# Patient Record
Sex: Female | Born: 1970
Health system: Southern US, Community
[De-identification: ages and names within clinical notes are randomized; demographics above are authoritative.]

## PROBLEM LIST (undated history)

## (undated) DIAGNOSIS — T7840XA Allergy, unspecified, initial encounter: Secondary | ICD-10-CM

## (undated) HISTORY — PX: WISDOM TOOTH EXTRACTION: SHX21

## (undated) HISTORY — DX: Allergy, unspecified, initial encounter: T78.40XA

---
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
## 1997-06-01 ENCOUNTER — Other Ambulatory Visit: Admission: RE | Admit: 1997-06-01 | Discharge: 1997-06-01 | Payer: Self-pay | Admitting: Obstetrics and Gynecology

## 1998-12-15 ENCOUNTER — Other Ambulatory Visit: Admission: RE | Admit: 1998-12-15 | Discharge: 1998-12-15 | Payer: Self-pay | Admitting: Obstetrics and Gynecology

## 2000-01-21 ENCOUNTER — Other Ambulatory Visit: Admission: RE | Admit: 2000-01-21 | Discharge: 2000-01-21 | Payer: Self-pay | Admitting: Obstetrics and Gynecology

## 2001-05-20 ENCOUNTER — Other Ambulatory Visit: Admission: RE | Admit: 2001-05-20 | Discharge: 2001-05-20 | Payer: Self-pay | Admitting: Obstetrics and Gynecology

## 2002-01-07 ENCOUNTER — Other Ambulatory Visit: Admission: RE | Admit: 2002-01-07 | Discharge: 2002-01-07 | Payer: Self-pay | Admitting: Obstetrics and Gynecology

## 2002-09-14 ENCOUNTER — Other Ambulatory Visit: Admission: RE | Admit: 2002-09-14 | Discharge: 2002-09-14 | Payer: Self-pay | Admitting: Obstetrics and Gynecology

## 2004-01-18 ENCOUNTER — Other Ambulatory Visit: Admission: RE | Admit: 2004-01-18 | Discharge: 2004-01-18 | Payer: Self-pay | Admitting: Obstetrics and Gynecology

## 2009-05-02 LAB — CONVERTED CEMR LAB: Pap Smear: NORMAL

## 2009-05-02 LAB — HM MAMMOGRAPHY: HM Mammogram: NORMAL

## 2010-03-23 ENCOUNTER — Other Ambulatory Visit: Payer: Self-pay | Admitting: Family Medicine

## 2010-03-23 ENCOUNTER — Ambulatory Visit
Admission: RE | Admit: 2010-03-23 | Discharge: 2010-03-23 | Payer: Self-pay | Source: Home / Self Care | Attending: Family Medicine | Admitting: Family Medicine

## 2010-03-23 LAB — HEPATIC FUNCTION PANEL
ALT: 11 U/L (ref 0–35)
AST: 17 U/L (ref 0–37)
Albumin: 4.3 g/dL (ref 3.5–5.2)
Alkaline Phosphatase: 51 U/L (ref 39–117)
Bilirubin, Direct: 0.1 mg/dL (ref 0.0–0.3)
Total Bilirubin: 0.7 mg/dL (ref 0.3–1.2)
Total Protein: 7.1 g/dL (ref 6.0–8.3)

## 2010-03-23 LAB — CBC WITH DIFFERENTIAL/PLATELET
Basophils Absolute: 0 10*3/uL (ref 0.0–0.1)
Basophils Relative: 0.6 % (ref 0.0–3.0)
Eosinophils Absolute: 0.2 10*3/uL (ref 0.0–0.7)
Eosinophils Relative: 3.1 % (ref 0.0–5.0)
HCT: 38.5 % (ref 36.0–46.0)
Hemoglobin: 13.2 g/dL (ref 12.0–15.0)
Lymphocytes Relative: 34.4 % (ref 12.0–46.0)
Lymphs Abs: 1.9 10*3/uL (ref 0.7–4.0)
MCHC: 34.2 g/dL (ref 30.0–36.0)
MCV: 94 fl (ref 78.0–100.0)
Monocytes Absolute: 0.4 10*3/uL (ref 0.1–1.0)
Monocytes Relative: 7.7 % (ref 3.0–12.0)
Neutro Abs: 3 10*3/uL (ref 1.4–7.7)
Neutrophils Relative %: 54.2 % (ref 43.0–77.0)
Platelets: 233 10*3/uL (ref 150.0–400.0)
RBC: 4.1 Mil/uL (ref 3.87–5.11)
RDW: 13.6 % (ref 11.5–14.6)
WBC: 5.5 10*3/uL (ref 4.5–10.5)

## 2010-03-23 LAB — BASIC METABOLIC PANEL
BUN: 8 mg/dL (ref 6–23)
CO2: 30 mEq/L (ref 19–32)
Calcium: 9.3 mg/dL (ref 8.4–10.5)
Chloride: 105 mEq/L (ref 96–112)
Creatinine, Ser: 0.6 mg/dL (ref 0.4–1.2)
GFR: 123.71 mL/min (ref >60.00)
Glucose, Bld: 79 mg/dL (ref 70–99)
Potassium: 4.2 mEq/L (ref 3.5–5.1)
Sodium: 142 mEq/L (ref 135–145)

## 2010-03-23 LAB — LIPID PANEL
Cholesterol: 162 mg/dL (ref 0–200)
HDL: 70.7 mg/dL (ref >39.00)
LDL Cholesterol: 86 mg/dL (ref 0–99)
Total CHOL/HDL Ratio: 2
Triglycerides: 27 mg/dL (ref 0.0–149.0)
VLDL: 5.4 mg/dL (ref 0.0–40.0)

## 2010-03-23 LAB — TSH: TSH: 0.63 u[IU]/mL (ref 0.35–5.50)

## 2010-04-05 NOTE — Assessment & Plan Note (Signed)
Summary: NEW TO EST//ALP   Vital Signs:  Patient profile:   40 year old female Menstrual status:  regular Height:      61 inches Weight:      122 pounds BMI:     23.14 Temp:     98.1 degrees F oral Pulse rate:   72 / minute Pulse rhythm:   regular Resp:     12 per minute BP sitting:   120 / 72  (left arm) Cuff size:   regular  Vitals Entered By: Sid Falcon LPN (March 23, 2010 10:22 AM)     Menstrual Status regular Last PAP Result normal   History of Present Illness: New patient to establish care for well care visit.  Sees gyn for pap smears and mammograms.  Patient has known chronic medical problems. She requests wellness visit today. No medications. No known allergies. No prior surgeries.  Family history. Mother coronary artery disease with CABG in her 79s. Also history type 2 diabetes. Father with stroke. Mother with hypertension history. Uncle with colon cancer and paternal grandmother with colon cancer.  Patient is divorced. One teenage daughter. Smokes only about 6 cigarettes per week. Rare alcohol. Works in Designer, jewellery.  Preventive Screening-Counseling & Management  Alcohol-Tobacco     Smoking Status: current     Packs/Day: <0.25  Allergies (verified): No Known Drug Allergies  Past History:  Family History: Last updated: 03/23/2010 Colon CA, paternal grandmother, uncle Father, stroke, hypertension Mother Type 2 DM and CABG 49s  Social History: Last updated: 03/23/2010 Occupation:  Designer, jewellery [employer] Divorced Current Smoker, 4-5 cigarettes day Alcohol use-yes one pregnancy one live birth  Risk Factors: Smoking Status: current (03/23/2010) Packs/Day: <0.25 (03/23/2010) PMH-FH-SH reviewed for relevance  Family History: Colon CA, paternal grandmother, uncle Father, stroke, hypertension Mother Type 2 DM and CABG 69s  Social History: Occupation:  Academic librarian Divorced Current Smoker, 4-5  cigarettes day Alcohol use-yes one pregnancy one live birth Occupation:  employed Smoking Status:  current Packs/Day:  <0.25  Review of Systems  The patient denies anorexia, fever, weight loss, weight gain, vision loss, decreased hearing, hoarseness, chest pain, syncope, dyspnea on exertion, peripheral edema, prolonged cough, headaches, hemoptysis, abdominal pain, melena, hematochezia, severe indigestion/heartburn, hematuria, incontinence, genital sores, muscle weakness, suspicious skin lesions, transient blindness, difficulty walking, depression, unusual weight change, abnormal bleeding, enlarged lymph nodes, angioedema, and breast masses.    Physical Exam  General:  Well-developed,well-nourished,in no acute distress; alert,appropriate and cooperative throughout examination Head:  Normocephalic and atraumatic without obvious abnormalities. No apparent alopecia or balding. Eyes:  No corneal or conjunctival inflammation noted. EOMI. Perrla. Funduscopic exam benign, without hemorrhages, exudates or papilledema. Vision grossly normal. Ears:  External ear exam shows no significant lesions or deformities.  Otoscopic examination reveals clear canals, tympanic membranes are intact bilaterally without bulging, retraction, inflammation or discharge. Hearing is grossly normal bilaterally. Mouth:  Oral mucosa and oropharynx without lesions or exudates.  Teeth in good repair. Neck:  No deformities, masses, or tenderness noted. Breasts:  gyn Lungs:  Normal respiratory effort, chest expands symmetrically. Lungs are clear to auscultation, no crackles or wheezes. Heart:  Normal rate and regular rhythm. S1 and S2 normal without gallop, murmur, click, rub or other extra sounds. Abdomen:  Bowel sounds positive,abdomen soft and non-tender without masses, organomegaly or hernias noted. Genitalia:  gyn Msk:  No deformity or scoliosis noted of thoracic or lumbar spine.   Extremities:  No clubbing, cyanosis,  edema, or deformity noted with  normal full range of motion of all joints.   Neurologic:  alert & oriented X3 and cranial nerves II-XII intact.   Skin:  no rashes and no suspicious lesions.   Cervical Nodes:  No lymphadenopathy noted Psych:  Oriented X3, good eye contact, not anxious appearing, and not depressed appearing.     Impression & Recommendations:  Problem # 1:  Preventive Health Care (ICD-V70.0) Discussed smoking cessation, need for regular exercise.  Screening labs obtained.   PVX given smoking status.  Complete Medication List: 1)  Mirena 20 Mcg/24hr Iud (Levonorgestrel) .... Use as directed per dr Tenny Craw  Other Orders: TLB-Lipid Panel (80061-LIPID) TLB-BMP (Basic Metabolic Panel-BMET) (80048-METABOL) TLB-CBC Platelet - w/Differential (85025-CBCD) TLB-Hepatic/Liver Function Pnl (80076-HEPATIC) TLB-TSH (Thyroid Stimulating Hormone) (84443-TSH) Specimen Handling (10272) Pneumococcal Vaccine (53664) Admin 1st Vaccine (40347)  Patient Instructions: 1)  It is important that you exercise reguarly at least 20 minutes 5 times a week. If you develop chest pain, have severe difficulty breathing, or feel very tired, stop exercising immediately and seek medical attention.  2)  Please schedule a follow-up appointment in 1 year.    Orders Added: 1)  TLB-Lipid Panel [80061-LIPID] 2)  TLB-BMP (Basic Metabolic Panel-BMET) [80048-METABOL] 3)  TLB-CBC Platelet - w/Differential [85025-CBCD] 4)  TLB-Hepatic/Liver Function Pnl [80076-HEPATIC] 5)  TLB-TSH (Thyroid Stimulating Hormone) [84443-TSH] 6)  Specimen Handling [99000] 7)  Pneumococcal Vaccine [90732] 8)  Admin 1st Vaccine [90471] 9)  New Patient 40-64 years [99386]   Immunizations Administered:  Pneumonia Vaccine:    Vaccine Type: Pneumovax    Site: left deltoid    Mfr: Merck    Dose: 0.5 ml    Route: IM    Given by: Sid Falcon LPN    Exp. Date: 07/03/2011    Lot #: [id number]   Immunizations  Administered:  Pneumonia Vaccine:    Vaccine Type: Pneumovax    Site: left deltoid    Mfr: Merck    Dose: 0.5 ml    Route: IM    Given by: Sid Falcon LPN    Exp. Date: 07/03/2011    Lot #: [id number]  Preventive Care Screening  Mammogram:    Date:  05/02/2009    Results:  normal   Pap Smear:    Date:  05/02/2009    Results:  normal   Last Tetanus Booster:    Date:  03/04/2008    Results:  Historical

## 2010-07-03 ENCOUNTER — Ambulatory Visit: Payer: Self-pay | Admitting: Internal Medicine

## 2010-07-06 ENCOUNTER — Ambulatory Visit (INDEPENDENT_AMBULATORY_CARE_PROVIDER_SITE_OTHER): Payer: 59 | Admitting: Family Medicine

## 2010-07-06 ENCOUNTER — Encounter: Payer: Self-pay | Admitting: Family Medicine

## 2010-07-06 VITALS — BP 120/80 | Temp 98.0°F | Ht 60.75 in | Wt 122.0 lb

## 2010-07-06 DIAGNOSIS — L259 Unspecified contact dermatitis, unspecified cause: Secondary | ICD-10-CM

## 2010-07-06 MED ORDER — METHYLPREDNISOLONE ACETATE 80 MG/ML IJ SUSP
80.0000 mg | Freq: Once | INTRAMUSCULAR | Status: AC
  Administered 2010-07-06: 80 mg via INTRAMUSCULAR

## 2010-07-06 NOTE — Progress Notes (Signed)
  Subjective:    Patient ID: Ruth Wong, female    DOB: 1970/10/01, 40 y.o.   MRN: [id number]  HPI Patient seen with pruritic rash left arm and bilateral breast past several days. Increase work outdoors recently. No prior history of contact dermatitis. Rash is pruritic. Nonpainful. No associated fever or chills. No alleviating factors. Not alleviated with antihistamines.  Exacerbated by heat.   Review of Systems  Constitutional: Negative for fever and chills.  Skin: Positive for rash.       Objective:   Physical Exam  Constitutional: She appears well-developed and well-nourished. No distress.  Cardiovascular: Normal rate, regular rhythm and normal heart sounds.   Pulmonary/Chest: Effort normal and breath sounds normal. She has no wheezes. She has no rales.  Musculoskeletal: She exhibits no edema.  Skin:       Patient has minimally raised vesicular rash left forearm which is nontender. Similar rash left breast greater than right mostly involving the areolar region.          Assessment & Plan:  Contact dermatitis. Reviewed options. Patient prefers IM steroid versus oral. Depo-Medrol 80 mg given. Discussed prevention measures.

## 2010-07-06 NOTE — Patient Instructions (Signed)
Poison Ivy (Toxicodendron Dermatitis) Poison ivy is a inflammation of the skin (contact dermatitis) caused by touching the allergens on the leaves of the ivy plant following previous exposure to the plant. The rash usually appears 48 hours after exposure. The rash is usually bumps (papules) or blisters (vesicles) in a linear pattern. Depending on your own sensitivity, the rash may simply cause redness and itching, or it may also progress to blisters which may break open. These must be well cared for to prevent secondary bacterial (germ) infection, followed by scarring. Keep any open areas dry, clean, dressed, and covered with an antibacterial ointment if needed. The eyes may also get puffy. The puffiness is worst in the morning and gets better as the day progresses. This dermatitis usually heals without scarring, within 2 to 3 weeks without treatment. HOME CARE INSTRUCTIONS Thoroughly wash with soap and water as soon as you have been exposed to poison ivy. You have about one half hour to remove the plant resin before it will cause the rash. This washing will destroy the oil or antigen on the skin that is causing, or will cause, the rash. Be sure to wash under your fingernails as any plant resin there will continue to spread the rash. Do not rub skin vigorously when washing affected area. Poison ivy cannot spread if no oil from the plant remains on your body. A rash that has progressed to weeping sores will not spread the rash unless you have not washed thoroughly. It is also important to wash any clothes you have been wearing as these may carry active allergens. The rash will return if you wear the unwashed clothing, even several days later. Avoidance of the plant in the future is the best measure. Poison ivy plant can be recognized by the number of leaves. Generally, poison ivy has three leaves with flowering branches on a single stem. Diphenhydramine may be purchased over the counter and used as needed for  itching. Do not drive with this medication if it makes you drowsy. Ask your caregiver about medication for children. SEEK MEDICAL CARE IF   Open sores develop.   Redness spreads beyond area of rash.   You notice purulent (pus-like) discharge.   You have increased pain.   Other signs of infection develop (such as fever).  Document Released: 02/16/2000 Document Re-Released: 02/06/2009 Exit[employer] Patient Information 2011 [employer]. 

## 2010-11-19 ENCOUNTER — Telehealth: Payer: Self-pay | Admitting: *Deleted

## 2010-11-19 MED ORDER — VALACYCLOVIR HCL 1 G PO TABS: ORAL_TABLET | ORAL | Status: DC

## 2010-11-19 NOTE — Telephone Encounter (Signed)
Will refill.

## 2010-11-19 NOTE — Telephone Encounter (Signed)
Pt is asking for Valtrex for oral fever blisters ASAP.

## 2011-05-01 ENCOUNTER — Telehealth: Payer: Self-pay | Admitting: *Deleted

## 2011-05-01 MED ORDER — VALACYCLOVIR HCL 1 G PO TABS: ORAL_TABLET | ORAL | Status: AC

## 2011-05-01 NOTE — Telephone Encounter (Signed)
OK to refill #30 tablets.

## 2011-05-01 NOTE — Telephone Encounter (Signed)
Pt.notified

## 2011-05-01 NOTE — Telephone Encounter (Signed)
Pt is asking for a refill of Valtrex for her fever blisters.

## 2012-06-29 ENCOUNTER — Ambulatory Visit (INDEPENDENT_AMBULATORY_CARE_PROVIDER_SITE_OTHER): Payer: BC Managed Care – PPO | Admitting: Family Medicine

## 2012-06-29 ENCOUNTER — Encounter: Payer: Self-pay | Admitting: Family Medicine

## 2012-06-29 VITALS — BP 112/80 | Temp 98.5°F | Wt 125.0 lb

## 2012-06-29 DIAGNOSIS — R300 Dysuria: Secondary | ICD-10-CM

## 2012-06-29 LAB — POCT URINALYSIS DIPSTICK
Bilirubin, UA: NEGATIVE
Glucose, UA: NEGATIVE
Ketones, UA: NEGATIVE
Leukocytes, UA: NEGATIVE
Nitrite, UA: POSITIVE
Protein, UA: NEGATIVE
Spec Grav, UA: 1.02
Urobilinogen, UA: 0.2
pH, UA: 6.5

## 2012-06-29 MED ORDER — CIPROFLOXACIN HCL 500 MG PO TABS: 500.0000 mg | ORAL_TABLET | Freq: Two times a day (BID) | ORAL | Status: DC

## 2012-06-29 NOTE — Progress Notes (Signed)
Chief Complaint  Patient presents with  . Abdominal Pain    frequent urination    HPI:  Acute visit for urinary dysuria: -2 days -symptoms: urinary frequency and urgency and some bladder pain/pressure when needs to urinate -she took some pyridium 2 days ago which helped -denies: vaginal symptoms, flank pain, NVD, fevers -reports no history of UTIs -no chance of pregnancy - mirena and husband has vasectomy  ROS: See pertinent positives and negatives per HPI.  No past medical history on file.  Family History  Problem Relation Age of Onset  . Diabetes Mother   . Heart disease Mother     cabg  . Stroke Father   . Hypertension Father   . Cancer Paternal Uncle     colon  . Cancer Paternal Grandmother     colon    History   Social History  . Marital Status: Divorced    Spouse Name: N/A    Number of Children: N/A  . Years of Education: N/A   Social History Main Topics  . Smoking status: Current Some Day Smoker -- 10 years  . Smokeless tobacco: None  . Alcohol Use: None  . Drug Use: None  . Sexually Active: None   Other Topics Concern  . None   Social History Narrative  . None    Current outpatient prescriptions:ciprofloxacin (CIPRO) 500 MG tablet, Take 1 tablet (500 mg total) by mouth 2 (two) times daily., Disp: 6 tablet, Rfl: 0;  levonorgestrel (MIRENA) 20 MCG/24HR IUD, 1 each by Intrauterine route once. As directed by Dr. Tenny Craw , Disp: , Rfl: ;  valACYclovir (VALTREX) 1000 MG tablet, Two tablets twice daily for one day prn cold sores, Disp: 30 tablet, Rfl: 0  EXAM:  Filed Vitals:   06/29/12 1433  BP: 112/80  Temp: 98.5 F (36.9 C)    Body mass index is 23.82 kg/(m^2).  GENERAL: vitals reviewed and listed above, alert, oriented, appears well hydrated and in no acute distress  HEENT: atraumatic, conjunttiva clear, no obvious abnormalities on inspection of external nose and ears  NECK: no obvious masses on inspection  LUNGS: clear to auscultation  bilaterally, no wheezes, rales or rhonchi, good air movement  CV: HRRR, no peripheral edema  ABD: soft, NTTP, no CVA TTP, no rebound or guarding  MS: moves all extremities without noticeable abnormality  PSYCH: pleasant and cooperative, no obvious depression or anxiety  ASSESSMENT AND PLAN:  Discussed the following assessment and plan:  Dysuria - Plan: DISCONTINUED: ciprofloxacin (CIPRO) 500 MG tablet  -nit+ tr bld on udip. Culture pending. Tx empirically with cipro - risks discussed. Advised plenty of fluids, cranberry/blueberry juice, return precuations.  -Patient advised to return or notify a doctor immediately if symptoms worsen or persist or new concerns arise.  There are no Patient Instructions on file for this visit.   Kriste Basque R.

## 2012-06-29 NOTE — Addendum Note (Signed)
Addended by: Azucena Freed on: 06/29/2012 02:50 PM   Modules accepted: Orders

## 2012-07-01 LAB — URINE CULTURE
Colony Count: NO GROWTH
Organism ID, Bacteria: NO GROWTH

## 2013-04-26 ENCOUNTER — Encounter: Payer: Self-pay | Admitting: Family Medicine

## 2013-04-26 ENCOUNTER — Ambulatory Visit (INDEPENDENT_AMBULATORY_CARE_PROVIDER_SITE_OTHER): Payer: BC Managed Care – PPO | Admitting: Family Medicine

## 2013-04-26 VITALS — BP 112/82 | HR 85 | Temp 98.1°F | Wt 126.0 lb

## 2013-04-26 DIAGNOSIS — B349 Viral infection, unspecified: Secondary | ICD-10-CM

## 2013-04-26 DIAGNOSIS — B9789 Other viral agents as the cause of diseases classified elsewhere: Secondary | ICD-10-CM

## 2013-04-26 NOTE — Progress Notes (Signed)
Pre visit review using our clinic review tool, if applicable. No additional management support is needed unless otherwise documented below in the visit note. 

## 2013-04-26 NOTE — Patient Instructions (Signed)

## 2013-04-26 NOTE — Progress Notes (Signed)
   Subjective:    Patient ID: Ruth Wong, female    DOB: 30-Oct-1970, 43 y.o.   MRN: [id number]  Neck Pain  Pertinent negatives include no fever.   Patient seen for acute visit with cough, nasal congestion, sore throat, and body aches over the past week. She does couple tender nodes left side of neck. Sore throat symptoms are relatively mild. She has not taken over-the-counter medications. Denies any fever. No nausea or vomiting. No skin rashes. Only mild intermittent headache  No past medical history on file. No past surgical history on file.  reports that she has been smoking.  She does not have any smokeless tobacco history on file. Her alcohol and drug histories are not on file. family history includes Cancer in her paternal grandmother and paternal uncle; Diabetes in her mother; Heart disease in her mother; Hypertension in her father; Stroke in her father. No Known Allergies    Review of Systems  Constitutional: Positive for fatigue. Negative for fever and chills.  HENT: Positive for congestion and sore throat.   Respiratory: Positive for cough.   Musculoskeletal: Positive for neck pain.       Objective:   Physical Exam  Constitutional: She appears well-developed and well-nourished.  HENT:  Right Ear: External ear normal.  Left Ear: External ear normal.  Mouth/Throat: Oropharynx is clear and moist.  Neck: Neck supple.  Couple of slightly tender left anterior cervical nodes. These are fairly small about 1/2 cm diameter  Cardiovascular: Normal rate.   Pulmonary/Chest: Effort normal and breath sounds normal. No respiratory distress. She has no wheezes. She has no rales.          Assessment & Plan:  Viral syndrome. Reassurance. She has likely some benign reactive adenopathy. Symptomatic treatment.

## 2013-04-27 ENCOUNTER — Other Ambulatory Visit (INDEPENDENT_AMBULATORY_CARE_PROVIDER_SITE_OTHER): Payer: BC Managed Care – PPO

## 2013-04-27 ENCOUNTER — Telehealth: Payer: Self-pay | Admitting: Family Medicine

## 2013-04-27 DIAGNOSIS — Z0000 Encounter for general adult medical examination without abnormal findings: Secondary | ICD-10-CM

## 2013-04-27 LAB — POCT URINALYSIS DIPSTICK
Bilirubin, UA: NEGATIVE
Glucose, UA: NEGATIVE
Ketones, UA: NEGATIVE
Nitrite, UA: NEGATIVE
Protein, UA: NEGATIVE
Spec Grav, UA: 1.025
Urobilinogen, UA: 0.2
pH, UA: 5.5

## 2013-04-27 LAB — CBC WITH DIFFERENTIAL/PLATELET
Basophils Absolute: 0 10*3/uL (ref 0.0–0.1)
Basophils Relative: 0.5 % (ref 0.0–3.0)
Eosinophils Absolute: 0.1 10*3/uL (ref 0.0–0.7)
Eosinophils Relative: 1.3 % (ref 0.0–5.0)
HCT: 41.6 % (ref 36.0–46.0)
Hemoglobin: 13.8 g/dL (ref 12.0–15.0)
Lymphocytes Relative: 18.9 % (ref 12.0–46.0)
Lymphs Abs: 1.7 10*3/uL (ref 0.7–4.0)
MCHC: 33.2 g/dL (ref 30.0–36.0)
MCV: 95.4 fl (ref 78.0–100.0)
Monocytes Absolute: 0.5 10*3/uL (ref 0.1–1.0)
Monocytes Relative: 5.8 % (ref 3.0–12.0)
Neutro Abs: 6.5 10*3/uL (ref 1.4–7.7)
Neutrophils Relative %: 73.5 % (ref 43.0–77.0)
Platelets: 250 10*3/uL (ref 150.0–400.0)
RBC: 4.36 Mil/uL (ref 3.87–5.11)
RDW: 13.6 % (ref 11.5–14.6)
WBC: 8.9 10*3/uL (ref 4.5–10.5)

## 2013-04-27 LAB — BASIC METABOLIC PANEL
BUN: 11 mg/dL (ref 6–23)
CO2: 27 mEq/L (ref 19–32)
Calcium: 9.4 mg/dL (ref 8.4–10.5)
Chloride: 106 mEq/L (ref 96–112)
Creatinine, Ser: 0.7 mg/dL (ref 0.4–1.2)
GFR: 104.78 mL/min (ref >60.00)
Glucose, Bld: 80 mg/dL (ref 70–99)
Potassium: 4.6 mEq/L (ref 3.5–5.1)
Sodium: 140 mEq/L (ref 135–145)

## 2013-04-27 LAB — HEPATIC FUNCTION PANEL
ALT: 16 U/L (ref 0–35)
AST: 19 U/L (ref 0–37)
Albumin: 4.2 g/dL (ref 3.5–5.2)
Alkaline Phosphatase: 44 U/L (ref 39–117)
Bilirubin, Direct: 0 mg/dL (ref 0.0–0.3)
Total Bilirubin: 0.6 mg/dL (ref 0.3–1.2)
Total Protein: 7.3 g/dL (ref 6.0–8.3)

## 2013-04-27 LAB — LIPID PANEL
Cholesterol: 162 mg/dL (ref 0–200)
HDL: 63.7 mg/dL (ref >39.00)
LDL Cholesterol: 86 mg/dL (ref 0–99)
Total CHOL/HDL Ratio: 3
Triglycerides: 61 mg/dL (ref 0.0–149.0)
VLDL: 12.2 mg/dL (ref 0.0–40.0)

## 2013-04-27 LAB — TSH: TSH: 0.5 u[IU]/mL (ref 0.35–5.50)

## 2013-04-27 NOTE — Telephone Encounter (Signed)
Relevant patient education assigned to patient using Emmi. ° °

## 2013-05-05 ENCOUNTER — Telehealth: Payer: Self-pay | Admitting: Family Medicine

## 2013-05-05 ENCOUNTER — Encounter: Payer: Self-pay | Admitting: Family Medicine

## 2013-05-05 ENCOUNTER — Ambulatory Visit (INDEPENDENT_AMBULATORY_CARE_PROVIDER_SITE_OTHER): Payer: BC Managed Care – PPO | Admitting: Family Medicine

## 2013-05-05 VITALS — BP 120/80 | HR 94 | Temp 97.6°F | Ht 60.75 in | Wt 127.0 lb

## 2013-05-05 DIAGNOSIS — R319 Hematuria, unspecified: Secondary | ICD-10-CM

## 2013-05-05 DIAGNOSIS — Z0000 Encounter for general adult medical examination without abnormal findings: Secondary | ICD-10-CM

## 2013-05-05 LAB — POCT URINALYSIS DIPSTICK
Bilirubin, UA: NEGATIVE
Glucose, UA: NEGATIVE
Ketones, UA: NEGATIVE
Leukocytes, UA: NEGATIVE
Nitrite, UA: NEGATIVE
Protein, UA: NEGATIVE
Spec Grav, UA: >=1.03
Urobilinogen, UA: 0.2
pH, UA: 5.5

## 2013-05-05 LAB — URINALYSIS, MICROSCOPIC ONLY

## 2013-05-05 NOTE — Progress Notes (Signed)
   Subjective:    Patient ID: Ruth Wong, female    DOB: 09/27/1970, 43 y.o.   MRN: [id number]  HPI Patient here for complete physical. She sees gynecologist regularly. She has Mirena IUD. She has had mammograms and Pap smears through gynecologist. She smokes but less than half a pack of cigarettes per week. She exercises fairly regularly. No specific complaints.  Family history reviewed and as below.  She's had some intermittent right shoulder pains over the past couple months. No injury. Pain is worse with abduction and internal rotation. No neck pain. No weakness. Pain is relatively mild  No past medical history on file. No past surgical history on file.  reports that she has been smoking.  She does not have any smokeless tobacco history on file. Her alcohol and drug histories are not on file. family history includes Cancer in her paternal grandmother and paternal uncle; Diabetes in her mother; Heart disease (age of onset: 86) in her mother; Hypertension in her father; Stroke in her father. No Known Allergies    Review of Systems  Constitutional: Negative for fever, activity change, appetite change, fatigue and unexpected weight change.  HENT: Negative for ear pain, hearing loss, sore throat and trouble swallowing.   Eyes: Negative for visual disturbance.  Respiratory: Negative for cough and shortness of breath.   Cardiovascular: Negative for chest pain and palpitations.  Gastrointestinal: Negative for abdominal pain, diarrhea, constipation and blood in stool.  Genitourinary: Negative for dysuria and hematuria.  Musculoskeletal: Negative for arthralgias, back pain and myalgias.  Skin: Negative for rash.  Neurological: Negative for dizziness, syncope and headaches.  Hematological: Negative for adenopathy.  Psychiatric/Behavioral: Negative for confusion and dysphoric mood.       Objective:   Physical Exam  Constitutional: She is oriented to person, place, and time. She  appears well-developed and well-nourished.  HENT:  Head: Normocephalic and atraumatic.  Eyes: EOM are normal. Pupils are equal, round, and reactive to light.  Neck: Normal range of motion. Neck supple. No thyromegaly present.  Cardiovascular: Normal rate, regular rhythm and normal heart sounds.   No murmur heard. Pulmonary/Chest: Breath sounds normal. No respiratory distress. She has no wheezes. She has no rales.  Abdominal: Soft. Bowel sounds are normal. She exhibits no distension and no mass. There is no tenderness. There is no rebound and no guarding.  Genitourinary:  Per GYN  Musculoskeletal: Normal range of motion. She exhibits no edema.  Right shoulder reveals full range of motion. She has mild pain with abduction greater than 90 and internal rotation  Lymphadenopathy:    She has no cervical adenopathy.  Neurological: She is alert and oriented to person, place, and time. She displays normal reflexes. No cranial nerve deficit.  Skin: No rash noted.  Psychiatric: She has a normal mood and affect. Her behavior is normal. Judgment and thought content normal.          Assessment & Plan:  Complete physical. Labs reviewed. She has trace blood on dipstick. Repeat and if positive send urine micro- Labs reviewed with no major concerns. Continue regular exercise habits.

## 2013-05-05 NOTE — Telephone Encounter (Signed)
Relevant patient education assigned to patient using Emmi. ° °

## 2013-05-05 NOTE — Progress Notes (Signed)
Pre visit review using our clinic review tool, if applicable. No additional management support is needed unless otherwise documented below in the visit note. 

## 2013-08-09 ENCOUNTER — Other Ambulatory Visit: Payer: Self-pay | Admitting: Obstetrics and Gynecology

## 2013-08-09 DIAGNOSIS — N6310 Unspecified lump in the right breast, unspecified quadrant: Secondary | ICD-10-CM

## 2013-08-13 ENCOUNTER — Ambulatory Visit
Admission: RE | Admit: 2013-08-13 | Discharge: 2013-08-13 | Disposition: A | Discharge status: Home / Self Care | Payer: BC Managed Care – PPO | Source: Ambulatory Visit | Attending: Obstetrics and Gynecology | Admitting: Obstetrics and Gynecology

## 2013-08-13 DIAGNOSIS — N6310 Unspecified lump in the right breast, unspecified quadrant: Secondary | ICD-10-CM

## 2014-07-15 ENCOUNTER — Other Ambulatory Visit (INDEPENDENT_AMBULATORY_CARE_PROVIDER_SITE_OTHER): Payer: Managed Care, Other (non HMO)

## 2014-07-15 DIAGNOSIS — Z0000 Encounter for general adult medical examination without abnormal findings: Secondary | ICD-10-CM | POA: Diagnosis not present

## 2014-07-15 LAB — CBC WITH DIFFERENTIAL/PLATELET
Basophils Absolute: 0 10*3/uL (ref 0.0–0.1)
Basophils Relative: 0.5 % (ref 0.0–3.0)
Eosinophils Absolute: 0.2 10*3/uL (ref 0.0–0.7)
Eosinophils Relative: 2.1 % (ref 0.0–5.0)
HCT: 39.5 % (ref 36.0–46.0)
Hemoglobin: 13.5 g/dL (ref 12.0–15.0)
Lymphocytes Relative: 17.6 % (ref 12.0–46.0)
Lymphs Abs: 1.5 10*3/uL (ref 0.7–4.0)
MCHC: 34.2 g/dL (ref 30.0–36.0)
MCV: 91.5 fl (ref 78.0–100.0)
Monocytes Absolute: 0.6 10*3/uL (ref 0.1–1.0)
Monocytes Relative: 7.1 % (ref 3.0–12.0)
Neutro Abs: 6.3 10*3/uL (ref 1.4–7.7)
Neutrophils Relative %: 72.7 % (ref 43.0–77.0)
Platelets: 261 10*3/uL (ref 150.0–400.0)
RBC: 4.32 Mil/uL (ref 3.87–5.11)
RDW: 14.1 % (ref 11.5–15.5)
WBC: 8.7 10*3/uL (ref 4.0–10.5)

## 2014-07-15 LAB — LIPID PANEL
Cholesterol: 157 mg/dL (ref 0–200)
HDL: 61.4 mg/dL (ref >39.00)
LDL Cholesterol: 88 mg/dL (ref 0–99)
NonHDL: 95.6
Total CHOL/HDL Ratio: 3
Triglycerides: 38 mg/dL (ref 0.0–149.0)
VLDL: 7.6 mg/dL (ref 0.0–40.0)

## 2014-07-15 LAB — COMPREHENSIVE METABOLIC PANEL
ALT: 15 U/L (ref 0–35)
AST: 18 U/L (ref 0–37)
Albumin: 4.1 g/dL (ref 3.5–5.2)
Alkaline Phosphatase: 54 U/L (ref 39–117)
BUN: 12 mg/dL (ref 6–23)
CO2: 27 mEq/L (ref 19–32)
Calcium: 9.3 mg/dL (ref 8.4–10.5)
Chloride: 103 mEq/L (ref 96–112)
Creatinine, Ser: 0.6 mg/dL (ref 0.40–1.20)
GFR: 114.3 mL/min (ref >60.00)
Glucose, Bld: 88 mg/dL (ref 70–99)
Potassium: 4 mEq/L (ref 3.5–5.1)
Sodium: 138 mEq/L (ref 135–145)
Total Bilirubin: 0.5 mg/dL (ref 0.2–1.2)
Total Protein: 7.1 g/dL (ref 6.0–8.3)

## 2014-07-15 LAB — TSH: TSH: 0.91 u[IU]/mL (ref 0.35–4.50)

## 2014-07-20 ENCOUNTER — Ambulatory Visit (INDEPENDENT_AMBULATORY_CARE_PROVIDER_SITE_OTHER): Payer: Managed Care, Other (non HMO) | Admitting: Family Medicine

## 2014-07-20 ENCOUNTER — Encounter: Payer: Self-pay | Admitting: Family Medicine

## 2014-07-20 ENCOUNTER — Telehealth: Payer: Self-pay

## 2014-07-20 VITALS — BP 120/80 | HR 91 | Temp 98.4°F | Ht 60.63 in | Wt 126.0 lb

## 2014-07-20 DIAGNOSIS — Z0000 Encounter for general adult medical examination without abnormal findings: Secondary | ICD-10-CM | POA: Diagnosis not present

## 2014-07-20 NOTE — Progress Notes (Signed)
   Subjective:    Patient ID: Ruth Wong, female    DOB: 08-27-1970, 44 y.o.   MRN: [id number]  HPI Patient here for complete physical. She sees gynecologist for yearly Pap smears. She has Mirena IUD. Still smokes about 3 cigarettes per day. Somewhat inconsistent exercise over the past year. She takes no regular prescription medications.  Family history significant for grandmother with colon cancer. Also, uncle died age 28 of colon cancer. No first-degree relatives with colon cancer. Brother age 51 apparently had polyps during the past year in she's not sure if these were adenomatous or hyperplastic. Patient has not had any change of bowel habits.  No past medical history on file. No past surgical history on file.  reports that she has been smoking.  She does not have any smokeless tobacco history on file. Her alcohol and drug histories are not on file. family history includes Cancer in her paternal grandmother and paternal uncle; Diabetes in her mother; Heart disease (age of onset: 19) in her mother; Hypertension in her father; Stroke in her father. No Known Allergies    Review of Systems  Constitutional: Negative for fever, activity change, appetite change, fatigue and unexpected weight change.  HENT: Negative for ear pain, hearing loss, sore throat and trouble swallowing.   Eyes: Negative for visual disturbance.  Respiratory: Negative for cough and shortness of breath.   Cardiovascular: Negative for chest pain and palpitations.  Gastrointestinal: Negative for abdominal pain, diarrhea, constipation and blood in stool.  Genitourinary: Negative for dysuria and hematuria.  Musculoskeletal: Negative for myalgias, back pain and arthralgias.  Skin: Negative for rash.  Neurological: Negative for dizziness, syncope and headaches.  Hematological: Negative for adenopathy.  Psychiatric/Behavioral: Negative for confusion and dysphoric mood.       Objective:   Physical Exam  Constitutional:  She is oriented to person, place, and time. She appears well-developed and well-nourished.  HENT:  Head: Normocephalic and atraumatic.  Eyes: EOM are normal. Pupils are equal, round, and reactive to light.  Neck: Normal range of motion. Neck supple. No thyromegaly present.  Cardiovascular: Normal rate, regular rhythm and normal heart sounds.   No murmur heard. Pulmonary/Chest: Breath sounds normal. No respiratory distress. She has no wheezes. She has no rales.  Abdominal: Soft. Bowel sounds are normal. She exhibits no distension and no mass. There is no tenderness. There is no rebound and no guarding.  Genitourinary:  Per GYN  Musculoskeletal: Normal range of motion. She exhibits no edema.  Lymphadenopathy:    She has no cervical adenopathy.  Neurological: She is alert and oriented to person, place, and time. She displays normal reflexes. No cranial nerve deficit.  Skin: No rash noted.  Psychiatric: She has a normal mood and affect. Her behavior is normal. Judgment and thought content normal.          Assessment & Plan:  Complete physical. Labs reviewed with no major concerns. She is strongly encouraged to stop smoking altogether and she has a goal of trying to quit this year. Establish more consistent exercise

## 2014-07-20 NOTE — Progress Notes (Signed)
Pre visit review using our clinic review tool, if applicable. No additional management support is needed unless otherwise documented below in the visit note. 

## 2014-07-20 NOTE — Telephone Encounter (Signed)
Pt states that her brother goes to get colonoscopy every 3 years

## 2014-07-20 NOTE — Patient Instructions (Signed)
Find out if your brother's polyps were adenomas (higher risk) or hyperplastic (low risk)

## 2014-07-26 NOTE — Telephone Encounter (Signed)
If brother has adenomas at 37 (and presumably he does if getting screened every 3 years) then I would recommend we go ahead and set her up for screening colonoscopy.

## 2014-07-27 ENCOUNTER — Encounter: Payer: Self-pay | Admitting: Gastroenterology

## 2014-07-27 ENCOUNTER — Other Ambulatory Visit: Payer: Self-pay | Admitting: Family Medicine

## 2014-07-27 DIAGNOSIS — Z1211 Encounter for screening for malignant neoplasm of colon: Secondary | ICD-10-CM

## 2014-07-27 NOTE — Telephone Encounter (Signed)
Pt informed. Per patient she would like to go ahead with colonoscopy. Referral is ordered.

## 2014-08-15 ENCOUNTER — Encounter: Payer: Self-pay | Admitting: Family Medicine

## 2014-08-15 ENCOUNTER — Ambulatory Visit (INDEPENDENT_AMBULATORY_CARE_PROVIDER_SITE_OTHER): Payer: Managed Care, Other (non HMO) | Admitting: Family Medicine

## 2014-08-15 VITALS — BP 110/80 | HR 68 | Temp 98.4°F | Wt 128.0 lb

## 2014-08-15 DIAGNOSIS — B49 Unspecified mycosis: Secondary | ICD-10-CM | POA: Diagnosis not present

## 2014-08-15 DIAGNOSIS — R21 Rash and other nonspecific skin eruption: Secondary | ICD-10-CM | POA: Diagnosis not present

## 2014-08-15 DIAGNOSIS — L300 Nummular dermatitis: Secondary | ICD-10-CM

## 2014-08-15 MED ORDER — NAFTIFINE HCL 1 % EX CREA: TOPICAL_CREAM | Freq: Every day | CUTANEOUS | Status: DC

## 2014-08-15 NOTE — Progress Notes (Signed)
   Subjective:    Patient ID: Ruth Wong, female    DOB: 30-Mar-1970, 44 y.o.   MRN: [id number]  HPI Patient seen with skin rash. She had appearance of nummular rash left forearm and right upper shoulder last week. Apparently coworker had ringworm. She started clotrimazole cream over-the-counter couple days ago was only uses for couple days. She has some itching. No pain. No vesicles.  No past medical history on file. No past surgical history on file.  reports that she has been smoking.  She does not have any smokeless tobacco history on file. Her alcohol and drug histories are not on file. family history includes Cancer in her paternal grandmother and paternal uncle; Diabetes in her mother; Heart disease (age of onset: 55) in her mother; Hypertension in her father; Stroke in her father. No Known Allergies    Review of Systems  Constitutional: Negative for fever and chills.  Skin: Positive for rash.       Objective:   Physical Exam  Constitutional: She appears well-developed and well-nourished.  Cardiovascular: Normal rate and regular rhythm.   Skin: Rash noted.  She has approximate 1 cm symmetric circumferential rash right forearm distal wrist and similar lesion right upper back. Elevated erythematous border with central scaling          Assessment & Plan:  Skin rash. Suspect ringworm. Continue over-the-counter clotrimazole. KOH scraping obtained. Consider course of Naftin cream if not responding to clotrimazole

## 2014-08-15 NOTE — Progress Notes (Signed)
Pre visit review using our clinic review tool, if applicable. No additional management support is needed unless otherwise documented below in the visit note. 

## 2014-08-15 NOTE — Patient Instructions (Signed)
Continue with the Clotrimazole for now.  We will call you with the KOH result- this will confirm whether this is fungal.

## 2014-08-16 LAB — KOH PREP: RESULT - KOH: NONE SEEN

## 2014-08-17 ENCOUNTER — Telehealth: Payer: Self-pay | Admitting: Family Medicine

## 2014-08-17 ENCOUNTER — Other Ambulatory Visit: Payer: Self-pay

## 2014-08-17 MED ORDER — TRIAMCINOLONE ACETONIDE 0.1 % EX CREA: 1.0000 "application " | TOPICAL_CREAM | Freq: Two times a day (BID) | CUTANEOUS | Status: DC

## 2014-08-17 NOTE — Telephone Encounter (Addendum)
Pt had KOH on 6-13 and would like the results

## 2014-08-17 NOTE — Telephone Encounter (Signed)
Pt informed

## 2014-09-28 ENCOUNTER — Ambulatory Visit: Payer: Managed Care, Other (non HMO) | Admitting: Gastroenterology

## 2015-04-03 ENCOUNTER — Other Ambulatory Visit: Payer: Self-pay | Admitting: Obstetrics and Gynecology

## 2015-04-03 DIAGNOSIS — N6320 Unspecified lump in the left breast, unspecified quadrant: Secondary | ICD-10-CM

## 2015-04-04 ENCOUNTER — Other Ambulatory Visit: Payer: Managed Care, Other (non HMO)

## 2015-04-07 ENCOUNTER — Ambulatory Visit
Admission: RE | Admit: 2015-04-07 | Discharge: 2015-04-07 | Disposition: A | Discharge status: Home / Self Care | Payer: Managed Care, Other (non HMO) | Source: Ambulatory Visit | Attending: Obstetrics and Gynecology | Admitting: Obstetrics and Gynecology

## 2015-04-07 DIAGNOSIS — N6320 Unspecified lump in the left breast, unspecified quadrant: Secondary | ICD-10-CM

## 2015-04-13 ENCOUNTER — Ambulatory Visit (INDEPENDENT_AMBULATORY_CARE_PROVIDER_SITE_OTHER): Payer: Managed Care, Other (non HMO) | Admitting: Family Medicine

## 2015-04-13 DIAGNOSIS — J069 Acute upper respiratory infection, unspecified: Secondary | ICD-10-CM | POA: Diagnosis not present

## 2015-04-13 DIAGNOSIS — B9789 Other viral agents as the cause of diseases classified elsewhere: Principal | ICD-10-CM

## 2015-04-13 MED ORDER — AMOXICILLIN 875 MG PO TABS: 875.0000 mg | ORAL_TABLET | Freq: Two times a day (BID) | ORAL | Status: DC

## 2015-04-13 NOTE — Progress Notes (Signed)
   Subjective:    Patient ID: Ruth Wong, female    DOB: [birth year], 45 y.o.   MRN: [id number]  HPI Acute visit for upper respiratory symptoms. Onset about 5 days ago. Postnasal drip symptoms. Mild body aches. Occasional dry cough. Denies any upper teeth pain or focal facial pain. Occasional mild headaches. No documented fever Nasal drainage has been slightly yellow in color  No past medical history on file. No past surgical history on file.  reports that she has been smoking.  She does not have any smokeless tobacco history on file. Her alcohol and drug histories are not on file. family history includes Cancer in her paternal grandmother and paternal uncle; Diabetes in her mother; Heart disease (age of onset: 23) in her mother; Hypertension in her father; Stroke in her father. No Known Allergies    Review of Systems  Constitutional: Negative for fever and chills.  HENT: Positive for congestion and sinus pressure.   Respiratory: Positive for cough.   Neurological: Positive for headaches.       Objective:   Physical Exam  Constitutional: She appears well-developed and well-nourished.  HENT:  Right Ear: External ear normal.  Left Ear: External ear normal.  Nose: Nose normal.  Mouth/Throat: Oropharynx is clear and moist.  Neck: Neck supple.  Cardiovascular: Normal rate and regular rhythm.   Pulmonary/Chest: Effort normal and breath sounds normal. No respiratory distress. She has no wheezes. She has no rales.  Lymphadenopathy:    She has no cervical adenopathy.          Assessment & Plan:  URI with cough. Suspect viral. We've recommended over-the-counter medications at this time. Consider Mucinex. Consider nasal saline irrigation. Would not start antibiotics this point but if she has any progressive symptoms in the next week consider amoxicillin 875 mg for 10 days twice daily

## 2015-05-05 ENCOUNTER — Other Ambulatory Visit: Payer: Self-pay | Admitting: Obstetrics and Gynecology

## 2015-05-05 DIAGNOSIS — N6320 Unspecified lump in the left breast, unspecified quadrant: Secondary | ICD-10-CM

## 2015-05-15 ENCOUNTER — Other Ambulatory Visit: Payer: Managed Care, Other (non HMO)

## 2015-05-16 ENCOUNTER — Other Ambulatory Visit: Payer: Managed Care, Other (non HMO)

## 2015-09-12 ENCOUNTER — Ambulatory Visit (INDEPENDENT_AMBULATORY_CARE_PROVIDER_SITE_OTHER): Payer: Managed Care, Other (non HMO) | Admitting: Family Medicine

## 2015-09-12 VITALS — BP 128/80 | HR 87 | Temp 98.4°F | Ht 60.63 in | Wt 122.0 lb

## 2015-09-12 DIAGNOSIS — J019 Acute sinusitis, unspecified: Secondary | ICD-10-CM | POA: Diagnosis not present

## 2015-09-12 MED ORDER — AMOXICILLIN 875 MG PO TABS: 875.0000 mg | ORAL_TABLET | Freq: Two times a day (BID) | ORAL | Status: DC

## 2015-09-12 NOTE — Patient Instructions (Signed)

## 2015-09-12 NOTE — Progress Notes (Signed)
Pre visit review using our clinic review tool, if applicable. No additional management support is needed unless otherwise documented below in the visit note. 

## 2015-09-12 NOTE — Progress Notes (Signed)
   Subjective:    Patient ID: Ruth Wong, female    DOB: 1971-01-27, 45 y.o.   MRN: [id number]  HPI Acute visit for sinus congestion. Onset about 4 days ago. She initially had some sore throat and had some mild body aches. She has fairly diffuse sinus pressure especially frontal sinuses. She has some cough which is nonproductive. Yellowish nasal mucus past couple days. Increased malaise. Upper teeth pain.  No past medical history on file. No past surgical history on file.  reports that she has been smoking.  She does not have any smokeless tobacco history on file. Her alcohol and drug histories are not on file. family history includes Cancer in her paternal grandmother and paternal uncle; Diabetes in her mother; Heart disease (age of onset: 36) in her mother; Hypertension in her father; Stroke in her father. No Known Allergies    Review of Systems  Constitutional: Positive for fatigue. Negative for fever and chills.  HENT: Positive for congestion.   Respiratory: Positive for cough. Negative for shortness of breath and wheezing.   Neurological: Positive for headaches.       Objective:   Physical Exam  Constitutional: She appears well-developed and well-nourished. No distress.  HENT:  Right Ear: External ear normal.  Left Ear: External ear normal.  Mouth/Throat: Oropharynx is clear and moist.  Neck: Neck supple.  Cardiovascular: Normal rate and regular rhythm.   Pulmonary/Chest: Effort normal and breath sounds normal. No respiratory distress. She has no wheezes. She has no rales.  Lymphadenopathy:    She has no cervical adenopathy.          Assessment & Plan:  Acute upper respiratory infection with cough. Suspect viral. We've recommended use of Mucinex and continue Aleve or Advil and increased hydration. We reviewed parameters for when to consider antibiotic such as progressive purulent discharge or duration over 10 days. Printed prescription for amoxicillin and would only  start if she develops worsening symptoms  Eulas Post MD [facility] at [facility]

## 2016-07-01 ENCOUNTER — Ambulatory Visit (INDEPENDENT_AMBULATORY_CARE_PROVIDER_SITE_OTHER): Payer: BLUE CROSS/BLUE SHIELD | Admitting: Family Medicine

## 2016-07-01 ENCOUNTER — Encounter: Payer: Self-pay | Admitting: Family Medicine

## 2016-07-01 VITALS — BP 120/80 | HR 76 | Temp 98.6°F | Wt 117.5 lb

## 2016-07-01 DIAGNOSIS — L237 Allergic contact dermatitis due to plants, except food: Secondary | ICD-10-CM

## 2016-07-01 MED ORDER — METHYLPREDNISOLONE ACETATE 80 MG/ML IJ SUSP
80.0000 mg | Freq: Once | INTRAMUSCULAR | Status: AC
  Administered 2016-07-01: 80 mg via INTRAMUSCULAR

## 2016-07-01 NOTE — Patient Instructions (Signed)
Poison Ivy Dermatitis Poison ivy dermatitis is inflammation of the skin that is caused by the allergens on the leaves of the poison ivy plant. The skin reaction often involves redness, swelling, blisters, and extreme itching. What are the causes? This condition is caused by a specific chemical (urushiol) found in the sap of the poison ivy plant. This chemical is sticky and can be easily spread to people, animals, and objects. You can get poison ivy dermatitis by:  Having direct contact with a poison ivy plant.  Touching animals, other people, or objects that have come in contact with poison ivy and have the chemical on them. What increases the risk? This condition is more likely to develop in:  People who are outdoors often.  People who go outdoors without wearing protective clothing, such as closed shoes, long pants, and a long-sleeved shirt. What are the signs or symptoms? Symptoms of this condition include:  Redness and itching.  A rash that often includes bumps and blisters. The rash usually appears 48 hours after exposure.  Swelling. This may occur if the reaction is more severe. Symptoms usually last for 1-2 weeks. However, the first time you develop this condition, symptoms may last 3-4 weeks. How is this diagnosed? This condition may be diagnosed based on your symptoms and a physical exam. Your health care provider may also ask you about any recent outdoor activity. How is this treated? Treatment for this condition will vary depending on how severe it is. Treatment may include:  Hydrocortisone creams or calamine lotions to relieve itching.  Oatmeal baths to soothe the skin.  Over-the-counter antihistamine tablets.  Oral steroid medicine for more severe outbreaks. Follow these instructions at home:  Take or apply over-the-counter and prescription medicines only as told by your health care provider.  Wash exposed skin as soon as possible with soap and cold water.  Use  hydrocortisone creams or calamine lotion as needed to soothe the skin and relieve itching.  Take oatmeal baths as needed. Use colloidal oatmeal. You can get this at your local pharmacy or grocery store. Follow the instructions on the packaging.  Do not scratch or rub your skin.  While you have the rash, wash clothes right after you wear them. How is this prevented?  Learn to identify the poison ivy plant and avoid contact with the plant. This plant can be recognized by the number of leaves. Generally, poison ivy has three leaves with flowering branches on a single stem. The leaves are typically glossy, and they have jagged edges that come to a point at the front.  If you have been exposed to poison ivy, thoroughly wash with soap and water right away. You have about 30 minutes to remove the plant resin before it will cause the rash. Be sure to wash under your fingernails because any plant resin there will continue to spread the rash.  When hiking or camping, wear clothes that will help you to avoid exposure on the skin. This includes long pants, a long-sleeved shirt, tall socks, and hiking boots. You can also apply preventive lotion to your skin to help limit exposure.  If you suspect that your clothes or outdoor gear came in contact with poison ivy, rinse them off outside with a garden hose before you bring them inside your house. Contact a health care provider if:  You have open sores in the rash area.  You have more redness, swelling, or pain in the affected area.  You have redness that spreads beyond  the rash area.  You have fluid, blood, or pus coming from the affected area.  You have a fever.  You have a rash over a large area of your body.  You have a rash on your eyes, mouth, or genitals.  Your rash does not improve after a few days. Get help right away if:  Your face swells or your eyes swell shut.  You have trouble breathing.  You have trouble swallowing. This  information is not intended to replace advice given to you by your health care provider. Make sure you discuss any questions you have with your health care provider. Document Released: 02/16/2000 Document Revised: 07/27/2015 Document Reviewed: 07/27/2014 Elsevier Interactive Patient Education  2017 [employer].

## 2016-07-01 NOTE — Progress Notes (Signed)
Subjective:     Patient ID: Ruth Wong, female   DOB: Mar 08, 1970, 46 y.o.   MRN: [id number]  HPI Patient seen with 2 day history of diffuse rash which is pruritic. She worked out in yard a lot last week. Few days ago she started breaking out with some pruritic blisters on her arms and lower extremities. Intense pruritus. No relief with topicals. History of contact dermatitis in the past.  No past medical history on file. No past surgical history on file.  reports that she has been smoking.  She has smoked for the past 10.00 years. She has never used smokeless tobacco. Her alcohol and drug histories are not on file. family history includes Cancer in her paternal grandmother and paternal uncle; Diabetes in her mother; Heart disease (age of onset: 64) in her mother; Hypertension in her father; Stroke in her father. No Known Allergies   Review of Systems  Constitutional: Negative for chills and fever.  Skin: Positive for rash.       Objective:   Physical Exam  Constitutional: She appears well-developed and well-nourished.  Cardiovascular: Normal rate and regular rhythm.  Exam reveals no gallop.   Pulmonary/Chest: Effort normal and breath sounds normal. No respiratory distress. She has no wheezes. She has no rales.  Skin: Rash noted.  Patient has multiple areas of scattered rash involving both forearms as well as legs bilaterally. She has some vesicles and raised linear erythematous lesions consistent with contact dermatitis       Assessment:     Contact dermatitis with widespread distribution    Plan:     -Reviewed options. Patient prefers intramuscular steroids versus pills. Depo-Medrol 80 mg IM. -Follow-up if symptoms persist or worsen  Eulas Post MD [facility] at [facility]

## 2016-07-01 NOTE — Progress Notes (Signed)
Pre visit review using our clinic review tool, if applicable. No additional management support is needed unless otherwise documented below in the visit note. 

## 2016-07-08 ENCOUNTER — Telehealth: Payer: Self-pay | Admitting: Family Medicine

## 2016-07-08 MED ORDER — PREDNISONE 10 MG PO TABS: ORAL_TABLET | ORAL | 0 refills | Status: DC

## 2016-07-08 NOTE — Telephone Encounter (Signed)
rx sent and patient is aware 

## 2016-07-08 NOTE — Telephone Encounter (Signed)
° °  Pt call to say her poison ivy is still there and was told to call back if it was not any better and a RX could be called in for her. Pt call today to req that [facility]

## 2016-07-08 NOTE — Telephone Encounter (Signed)
Prednisone 10 mg taper: 4-4-4-3-3-2-2-1-1  #24

## 2016-11-21 ENCOUNTER — Encounter: Payer: Self-pay | Admitting: Family Medicine

## 2017-03-13 DIAGNOSIS — Z130 Encounter for screening for diseases of the blood and blood-forming organs and certain disorders involving the immune mechanism: Secondary | ICD-10-CM | POA: Diagnosis not present

## 2017-03-13 DIAGNOSIS — Z30431 Encounter for routine checking of intrauterine contraceptive device: Secondary | ICD-10-CM | POA: Diagnosis not present

## 2017-03-13 DIAGNOSIS — Z6822 Body mass index (BMI) 22.0-22.9, adult: Secondary | ICD-10-CM | POA: Diagnosis not present

## 2017-03-13 DIAGNOSIS — Z1231 Encounter for screening mammogram for malignant neoplasm of breast: Secondary | ICD-10-CM | POA: Diagnosis not present

## 2017-03-13 DIAGNOSIS — Z01419 Encounter for gynecological examination (general) (routine) without abnormal findings: Secondary | ICD-10-CM | POA: Diagnosis not present

## 2017-03-13 DIAGNOSIS — Z1389 Encounter for screening for other disorder: Secondary | ICD-10-CM | POA: Diagnosis not present

## 2017-03-13 DIAGNOSIS — A609 Anogenital herpesviral infection, unspecified: Secondary | ICD-10-CM | POA: Diagnosis not present

## 2017-07-22 ENCOUNTER — Encounter: Payer: Self-pay | Admitting: Family Medicine

## 2017-07-22 ENCOUNTER — Ambulatory Visit: Payer: BLUE CROSS/BLUE SHIELD | Admitting: Family Medicine

## 2017-07-22 VITALS — BP 100/58 | HR 73 | Temp 98.4°F | Wt 120.7 lb

## 2017-07-22 DIAGNOSIS — J019 Acute sinusitis, unspecified: Secondary | ICD-10-CM

## 2017-07-22 DIAGNOSIS — H9202 Otalgia, left ear: Secondary | ICD-10-CM | POA: Diagnosis not present

## 2017-07-22 MED ORDER — AMOXICILLIN-POT CLAVULANATE 875-125 MG PO TABS: 1.0000 | ORAL_TABLET | Freq: Two times a day (BID) | ORAL | 0 refills | Status: DC

## 2017-07-22 NOTE — Progress Notes (Signed)
  Subjective:     Patient ID: Ruth Wong, female   DOB: 06/13/1970, 47 y.o.   MRN: [id number]  HPI Patient seen and left ear pain, sore throat, cough, and sinus congestion over the past several days. Onset late last week. She's had some body aches and low-grade fevers intermittently. She's having some thick discolored mucus both nasally and coughing up some as well. No ear drainage. No hearing changes.  Does not have a history generally of spring pollen allergies. Occasional headaches. Intermittent left maxillary sinus pressure  No past medical history on file. No past surgical history on file.  reports that she has been smoking.  She has smoked for the past 10.00 years. She has never used smokeless tobacco. Her alcohol and drug histories are not on file. family history includes Cancer in her paternal grandmother and paternal uncle; Diabetes in her mother; Heart disease (age of onset: 80) in her mother; Hypertension in her father; Stroke in her father. No Known Allergies   Review of Systems  Constitutional: Positive for fever.  HENT: Positive for congestion, sinus pressure, sinus pain and sore throat.   Respiratory: Positive for cough.        Objective:   Physical Exam  Constitutional: She appears well-developed and well-nourished.  HENT:  Right Ear: Tympanic membrane and ear canal normal.  Left Ear: Tympanic membrane and ear canal normal.  Mouth/Throat: Oropharynx is clear and moist and mucous membranes are normal. No oropharyngeal exudate or posterior oropharyngeal erythema.  Neck: Neck supple.  Cardiovascular: Normal rate.  Pulmonary/Chest: Effort normal and breath sounds normal. She has no wheezes. She has no rales.  Lymphadenopathy:    She has no cervical adenopathy.       Assessment:     Patient presents with URI type symptoms. Differential is viral versus allergic versus early sinusitis    Plan:     -Recommend conservative measures with good hydration and consider  over-the-counter antihistamine -Discussed possible use of saline nasal irrigation with Netti pot -Printed prescription for Augmentin to start 875 mg twice daily if she has any progressive or worsening symptoms over the next several days  Eulas Post MD [facility] at [facility]

## 2017-07-22 NOTE — Patient Instructions (Signed)

## 2017-08-07 ENCOUNTER — Telehealth: Payer: Self-pay | Admitting: Family Medicine

## 2017-08-07 ENCOUNTER — Ambulatory Visit: Payer: BLUE CROSS/BLUE SHIELD | Admitting: Family Medicine

## 2017-08-07 ENCOUNTER — Encounter: Payer: Self-pay | Admitting: Family Medicine

## 2017-08-07 VITALS — BP 104/70 | HR 76 | Temp 98.2°F | Ht 60.63 in | Wt 120.6 lb

## 2017-08-07 DIAGNOSIS — L237 Allergic contact dermatitis due to plants, except food: Secondary | ICD-10-CM

## 2017-08-07 MED ORDER — PREDNISONE 10 MG PO TABS: ORAL_TABLET | ORAL | 0 refills | Status: DC

## 2017-08-07 MED ORDER — TRIAMCINOLONE ACETONIDE 0.1 % EX CREA: 1.0000 "application " | TOPICAL_CREAM | Freq: Two times a day (BID) | CUTANEOUS | 0 refills | Status: DC

## 2017-08-07 NOTE — Telephone Encounter (Signed)
Please advise 

## 2017-08-07 NOTE — Telephone Encounter (Signed)
Pt scheduled at [facility] today.

## 2017-08-07 NOTE — Telephone Encounter (Signed)
Copied from [place]. Topic: Quick Communication - See Telephone Encounter >> Aug 07, 2017  8:55 AM Ivar Drape wrote: CRM for notification. See Telephone encounter for: 08/07/17. Patient has Poison Ivy on her face and neck and would like something called in for it.

## 2017-08-07 NOTE — Telephone Encounter (Signed)
Patient calling back, spreading rapidly. Does she need appt?  [phone]

## 2017-08-07 NOTE — Progress Notes (Signed)
Patient: Ruth Wong MRN: [id number] DOB: 09-Feb-1971 PCP: Eulas Post, MD     Subjective:  Chief Complaint  Patient presents with  . Poison Ivy    on face and hands    HPI: The patient is a 47 y.o. female who presents today for possible poison ivy. She states last year she had such a bad case of poison ivy. She now has poison ivy again on her face and hands. Wrist and neck.  First noticed the itching/rash on her hand last night and then her face this AM. She has been working in the back yard and likely where she was exposed. She did have long sleeves and gloves on.   Review of Systems  Constitutional: Negative for fever.  HENT: Negative for congestion and sinus pain.   Respiratory: Negative for cough, chest tightness, shortness of breath and wheezing.   Cardiovascular: Negative for chest pain, palpitations and leg swelling.  Musculoskeletal: Negative for back pain and joint swelling.  Psychiatric/Behavioral: Negative for agitation and confusion.    Allergies Patient has No Known Allergies.  Past Medical History Patient  has no past medical history on file.  Surgical History Patient  has no past surgical history on file.  Family History Pateint's family history includes Cancer in her paternal grandmother and paternal uncle; Diabetes in her mother; Heart disease (age of onset: 51) in her mother; Hypertension in her father; Stroke in her father.  Social History Patient  reports that she has been smoking.  She has smoked for the past 10.00 years. She has never used smokeless tobacco.    Objective: Vitals:   08/07/17 1404  BP: 104/70  Pulse: 76  Temp: 98.2 F (36.8 C)  TempSrc: Oral  SpO2: 98%  Weight: 120 lb 9.6 oz (54.7 kg)  Height: 5' 0.63" (1.54 m)    Body mass index is 23.07 kg/m.  Physical Exam  Constitutional: She appears well-developed and well-nourished.  HENT:  Head: Normocephalic and atraumatic.  Eyes: Pupils are equal, round, and reactive to  light. Conjunctivae and EOM are normal. Right eye exhibits no discharge. Left eye exhibits no discharge.  Skin: Rash noted.  Linear, erythematous rash with vesicles over her right hand, wrist and on her neck and cheeks.   Vitals reviewed.      Assessment/plan: 1. Contact dermatitis due to poison ivy Since on face will do steroid taper. Discussed side effects of steroids and to take daily and finish course. Do not skip doses. Wash clothes in hot water, oatmeal baths, hydrocortisone on face to help itching and will send in triamcinolone to help itching on skin. Not to use on face or past 10 days.    Return if symptoms worsen or fail to improve.   Orma Flaming, MD [place]   08/07/2017

## 2017-08-25 ENCOUNTER — Telehealth: Payer: Self-pay | Admitting: Family Medicine

## 2017-08-25 ENCOUNTER — Encounter: Payer: Self-pay | Admitting: Family Medicine

## 2017-08-25 ENCOUNTER — Ambulatory Visit: Payer: BLUE CROSS/BLUE SHIELD | Admitting: Family Medicine

## 2017-08-25 VITALS — BP 110/70 | HR 91 | Temp 99.2°F | Wt 118.1 lb

## 2017-08-25 DIAGNOSIS — Z9109 Other allergy status, other than to drugs and biological substances: Secondary | ICD-10-CM

## 2017-08-25 MED ORDER — METHYLPREDNISOLONE ACETATE 80 MG/ML IJ SUSP
80.0000 mg | Freq: Once | INTRAMUSCULAR | Status: AC
  Administered 2017-08-25: 80 mg via INTRAMUSCULAR

## 2017-08-25 NOTE — Telephone Encounter (Signed)
Pt notified of results/instructions and verbalized understanding. Same day appt scheduled.

## 2017-08-25 NOTE — Telephone Encounter (Signed)
We probably should see her. Thanks.

## 2017-08-25 NOTE — Progress Notes (Signed)
  Subjective:     Patient ID: Ruth Wong, female   DOB: Sep 01, 1970, 47 y.o.   MRN: [id number]  HPI Patient seen with some recurrent pruritic rash on her face. She was seen on the sixth of this month with contact dermatitis and was treated with 16 day taper. She was doing better when she was on the prednisone but then after coming off his had some recurrence of pruritic rash scattered on both sides of the face. She has minimal involvement on her hand. No relief with topicals.  No past medical history on file. No past surgical history on file.  reports that she has been smoking.  She has smoked for the past 10.00 years. She has never used smokeless tobacco. Her alcohol and drug histories are not on file. family history includes Cancer in her paternal grandmother and paternal uncle; Diabetes in her mother; Heart disease (age of onset: 51) in her mother; Hypertension in her father; Stroke in her father. No Known Allergies   Review of Systems  Constitutional: Negative for chills and fever.  Skin: Positive for rash.       Objective:   Physical Exam  Constitutional: She appears well-developed and well-nourished.  Cardiovascular: Normal rate and regular rhythm.  Skin: Rash noted.  Patient has rash scattered on her face with erythematous slightly raised surface       Assessment:     Contact dermatitis with recurrence after stopping prednisone recently    Plan:     -Patient requesting "injection". We agreed to Depo-Medrol 80 mg IM and touch base if rash not resolving over the next week  Eulas Post MD [facility] at [facility]

## 2017-08-25 NOTE — Telephone Encounter (Signed)
Please advise. She saw Dr. Rogers Blocker on 08/07/17. Since she is not better and it has been 2.5 weeks, would you like to see her for office visit instead of doing a nurse visit? Thanks!

## 2017-08-25 NOTE — Patient Instructions (Signed)
Poison Ivy Dermatitis Poison ivy dermatitis is inflammation of the skin that is caused by the allergens on the leaves of the poison ivy plant. The skin reaction often involves redness, swelling, blisters, and extreme itching. What are the causes? This condition is caused by a specific chemical (urushiol) found in the sap of the poison ivy plant. This chemical is sticky and can be easily spread to people, animals, and objects. You can get poison ivy dermatitis by:  Having direct contact with a poison ivy plant.  Touching animals, other people, or objects that have come in contact with poison ivy and have the chemical on them.  What increases the risk? This condition is more likely to develop in:  People who are outdoors often.  People who go outdoors without wearing protective clothing, such as closed shoes, long pants, and a long-sleeved shirt.  What are the signs or symptoms? Symptoms of this condition include:  Redness and itching.  A rash that often includes bumps and blisters. The rash usually appears 48 hours after exposure.  Swelling. This may occur if the reaction is more severe.  Symptoms usually last for 1-2 weeks. However, the first time you develop this condition, symptoms may last 3-4 weeks. How is this diagnosed? This condition may be diagnosed based on your symptoms and a physical exam. Your health care provider may also ask you about any recent outdoor activity. How is this treated? Treatment for this condition will vary depending on how severe it is. Treatment may include:  Hydrocortisone creams or calamine lotions to relieve itching.  Oatmeal baths to soothe the skin.  Over-the-counter antihistamine tablets.  Oral steroid medicine for more severe outbreaks.  Follow these instructions at home:  Take or apply over-the-counter and prescription medicines only as told by your health care provider.  Wash exposed skin as soon as possible with soap and cold  water.  Use hydrocortisone creams or calamine lotion as needed to soothe the skin and relieve itching.  Take oatmeal baths as needed. Use colloidal oatmeal. You can get this at your local pharmacy or grocery store. Follow the instructions on the packaging.  Do not scratch or rub your skin.  While you have the rash, wash clothes right after you wear them. How is this prevented?  Learn to identify the poison ivy plant and avoid contact with the plant. This plant can be recognized by the number of leaves. Generally, poison ivy has three leaves with flowering branches on a single stem. The leaves are typically glossy, and they have jagged edges that come to a point at the front.  If you have been exposed to poison ivy, thoroughly wash with soap and water right away. You have about 30 minutes to remove the plant resin before it will cause the rash. Be sure to wash under your fingernails because any plant resin there will continue to spread the rash.  When hiking or camping, wear clothes that will help you to avoid exposure on the skin. This includes long pants, a long-sleeved shirt, tall socks, and hiking boots. You can also apply preventive lotion to your skin to help limit exposure.  If you suspect that your clothes or outdoor gear came in contact with poison ivy, rinse them off outside with a garden hose before you bring them inside your house. Contact a health care provider if:  You have open sores in the rash area.  You have more redness, swelling, or pain in the affected area.  You have   redness that spreads beyond the rash area.  You have fluid, blood, or pus coming from the affected area.  You have a fever.  You have a rash over a large area of your body.  You have a rash on your eyes, mouth, or genitals.  Your rash does not improve after a few days. Get help right away if:  Your face swells or your eyes swell shut.  You have trouble breathing.  You have trouble  swallowing. This information is not intended to replace advice given to you by your health care provider. Make sure you discuss any questions you have with your health care provider. Document Released: 02/16/2000 Document Revised: 07/27/2015 Document Reviewed: 07/27/2014 Elsevier Interactive Patient Education  2018 [employer]  

## 2017-08-25 NOTE — Telephone Encounter (Signed)
Copied from [place] [id number]. Topic: Quick Communication - See Telephone Encounter >> Aug 25, 2017 10:02 AM Synthia Innocent wrote: CRM for notification. See Telephone encounter for: 08/25/17. Patient seen on 08/07/17 for poison ivy, not any better, would like a shot of prednisone if able to just see the nurse. Seen Dr Rogers Blocker at [facility]. Please advise

## 2018-03-23 ENCOUNTER — Ambulatory Visit (INDEPENDENT_AMBULATORY_CARE_PROVIDER_SITE_OTHER): Payer: BLUE CROSS/BLUE SHIELD | Admitting: Family Medicine

## 2018-03-23 ENCOUNTER — Other Ambulatory Visit: Payer: Self-pay

## 2018-03-23 ENCOUNTER — Encounter: Payer: Self-pay | Admitting: Family Medicine

## 2018-03-23 VITALS — BP 116/80 | HR 75 | Temp 98.5°F | Ht 60.75 in | Wt 124.4 lb

## 2018-03-23 DIAGNOSIS — Z0000 Encounter for general adult medical examination without abnormal findings: Secondary | ICD-10-CM

## 2018-03-23 DIAGNOSIS — Z23 Encounter for immunization: Secondary | ICD-10-CM

## 2018-03-23 NOTE — Progress Notes (Signed)
Subjective:     Patient ID: Ruth Wong, female   DOB: [birth year], 48 y.o.   MRN: [id number]  HPI Patient seen for physical.  Generally very healthy.  She still smokes about 3 cigarettes/day.  She still hopes to quit.  She will turn 48 in March.  She takes no regular medications.  She helps take care of her dad who had a stroke several years ago.  She would like to get colonoscopy at age 47.  Last tetanus was 10 years ago.  Still needs flu vaccine.  Has not had screening labs in quite some time.  She still sees gynecologist for Pap smears and mammograms.  History reviewed. No pertinent past medical history. History reviewed. No pertinent surgical history.  reports that she has been smoking. She has smoked for the past 10.00 years. She has never used smokeless tobacco. No history on file for alcohol and drug. family history includes Cancer in her paternal grandmother and paternal uncle; Diabetes in her mother; Heart disease (age of onset: 51) in her mother; Hypertension in her father; Stroke in her father. No Known Allergies   Review of Systems  Constitutional: Negative for activity change, appetite change, fatigue, fever and unexpected weight change.  HENT: Negative for ear pain, hearing loss, sore throat and trouble swallowing.   Eyes: Negative for visual disturbance.  Respiratory: Negative for cough and shortness of breath.   Cardiovascular: Negative for chest pain and palpitations.  Gastrointestinal: Negative for abdominal pain, blood in stool, constipation and diarrhea.  Genitourinary: Negative for dysuria and hematuria.  Musculoskeletal: Negative for arthralgias, back pain and myalgias.  Skin: Negative for rash.  Neurological: Negative for dizziness, syncope and headaches.  Hematological: Negative for adenopathy.  Psychiatric/Behavioral: Negative for confusion and dysphoric mood.       Objective:   Physical Exam Constitutional:      Appearance: She is well-developed.  HENT:   Head: Normocephalic and atraumatic.  Eyes:     Pupils: Pupils are equal, round, and reactive to light.  Neck:     Musculoskeletal: Normal range of motion and neck supple.     Thyroid: No thyromegaly.  Cardiovascular:     Rate and Rhythm: Normal rate and regular rhythm.     Heart sounds: Normal heart sounds. No murmur.  Pulmonary:     Effort: No respiratory distress.     Breath sounds: Normal breath sounds. No wheezing or rales.  Abdominal:     General: Bowel sounds are normal. There is no distension.     Palpations: Abdomen is soft. There is no mass.     Tenderness: There is no abdominal tenderness. There is no guarding or rebound.  Genitourinary:    Comments: Per gyn Musculoskeletal: Normal range of motion.  Lymphadenopathy:     Cervical: No cervical adenopathy.  Skin:    Findings: No rash.  Neurological:     Mental Status: She is alert and oriented to person, place, and time.     Cranial Nerves: No cranial nerve deficit.     Deep Tendon Reflexes: Reflexes normal.  Psychiatric:        Behavior: Behavior normal.        Thought Content: Thought content normal.        Judgment: Judgment normal.        Assessment:     Physical exam.  Generally healthy 48 year old female who gets regular GYN checkups.  The following health maintenance issues were addressed today    Plan:     -  Set up referral for colonoscopy for after turning 48 -Flu vaccine and tetanus given today -She will set up to return tomorrow for fasting labs -Consider Shingrix vaccine after turning age 49 -Encouraged to stop smoking altogether  Eulas Post MD [facility]

## 2018-03-23 NOTE — Addendum Note (Signed)
Addended by: Anibal Henderson on: 03/23/2018 03:02 PM   Modules accepted: Orders

## 2018-03-23 NOTE — Patient Instructions (Signed)
We will set up colonoscopy for after age 48.    Consider Shingrix vaccine after turning age 14- check on insurance coverage.

## 2018-03-24 ENCOUNTER — Encounter: Payer: Self-pay | Admitting: Family Medicine

## 2018-03-24 LAB — CBC WITH DIFFERENTIAL/PLATELET
Basophils Absolute: 0.1 10*3/uL (ref 0.0–0.1)
Basophils Relative: 0.8 % (ref 0.0–3.0)
Eosinophils Absolute: 0.2 10*3/uL (ref 0.0–0.7)
Eosinophils Relative: 2.7 % (ref 0.0–5.0)
HCT: 40.7 % (ref 36.0–46.0)
Hemoglobin: 13.7 g/dL (ref 12.0–15.0)
Lymphocytes Relative: 21.2 % (ref 12.0–46.0)
Lymphs Abs: 1.6 10*3/uL (ref 0.7–4.0)
MCHC: 33.8 g/dL (ref 30.0–36.0)
MCV: 93.8 fl (ref 78.0–100.0)
Monocytes Absolute: 0.6 10*3/uL (ref 0.1–1.0)
Monocytes Relative: 7.7 % (ref 3.0–12.0)
Neutro Abs: 5.1 10*3/uL (ref 1.4–7.7)
Neutrophils Relative %: 67.6 % (ref 43.0–77.0)
Platelets: 215 10*3/uL (ref 150.0–400.0)
RBC: 4.34 Mil/uL (ref 3.87–5.11)
RDW: 13.5 % (ref 11.5–15.5)
WBC: 7.6 10*3/uL (ref 4.0–10.5)

## 2018-03-24 LAB — HEPATIC FUNCTION PANEL
ALT: 12 U/L (ref 0–35)
AST: 14 U/L (ref 0–37)
Albumin: 4.4 g/dL (ref 3.5–5.2)
Alkaline Phosphatase: 50 U/L (ref 39–117)
Bilirubin, Direct: 0.2 mg/dL (ref 0.0–0.3)
Total Bilirubin: 1.1 mg/dL (ref 0.2–1.2)
Total Protein: 6.9 g/dL (ref 6.0–8.3)

## 2018-03-24 LAB — LIPID PANEL
Cholesterol: 172 mg/dL (ref 0–200)
HDL: 63.9 mg/dL (ref >39.00)
LDL Cholesterol: 99 mg/dL (ref 0–99)
NonHDL: 107.91
Total CHOL/HDL Ratio: 3
Triglycerides: 47 mg/dL (ref 0.0–149.0)
VLDL: 9.4 mg/dL (ref 0.0–40.0)

## 2018-03-24 LAB — BASIC METABOLIC PANEL
BUN: 11 mg/dL (ref 6–23)
CO2: 29 mEq/L (ref 19–32)
Calcium: 9.5 mg/dL (ref 8.4–10.5)
Chloride: 104 mEq/L (ref 96–112)
Creatinine, Ser: 0.64 mg/dL (ref 0.40–1.20)
GFR: 98.28 mL/min (ref >60.00)
Glucose, Bld: 88 mg/dL (ref 70–99)
Potassium: 4.4 mEq/L (ref 3.5–5.1)
Sodium: 140 mEq/L (ref 135–145)

## 2018-03-24 LAB — TSH: TSH: 1.08 u[IU]/mL (ref 0.35–4.50)

## 2018-03-24 NOTE — Addendum Note (Signed)
Addended by: Elmer Picker on: 03/24/2018 09:37 AM   Modules accepted: Orders

## 2018-04-03 ENCOUNTER — Encounter: Payer: Self-pay | Admitting: Internal Medicine

## 2018-04-13 DIAGNOSIS — Z30431 Encounter for routine checking of intrauterine contraceptive device: Secondary | ICD-10-CM | POA: Diagnosis not present

## 2018-04-13 DIAGNOSIS — A609 Anogenital herpesviral infection, unspecified: Secondary | ICD-10-CM | POA: Diagnosis not present

## 2018-04-13 DIAGNOSIS — Z124 Encounter for screening for malignant neoplasm of cervix: Secondary | ICD-10-CM | POA: Diagnosis not present

## 2018-04-13 DIAGNOSIS — Z01419 Encounter for gynecological examination (general) (routine) without abnormal findings: Secondary | ICD-10-CM | POA: Diagnosis not present

## 2018-04-13 DIAGNOSIS — Z1231 Encounter for screening mammogram for malignant neoplasm of breast: Secondary | ICD-10-CM | POA: Diagnosis not present

## 2018-04-22 ENCOUNTER — Other Ambulatory Visit: Payer: Self-pay | Admitting: Obstetrics and Gynecology

## 2018-04-22 DIAGNOSIS — R928 Other abnormal and inconclusive findings on diagnostic imaging of breast: Secondary | ICD-10-CM

## 2018-04-23 ENCOUNTER — Ambulatory Visit
Admission: RE | Admit: 2018-04-23 | Discharge: 2018-04-23 | Disposition: A | Discharge status: Home / Self Care | Payer: BLUE CROSS/BLUE SHIELD | Source: Ambulatory Visit | Attending: Obstetrics and Gynecology | Admitting: Obstetrics and Gynecology

## 2018-04-23 DIAGNOSIS — N6012 Diffuse cystic mastopathy of left breast: Secondary | ICD-10-CM | POA: Diagnosis not present

## 2018-04-23 DIAGNOSIS — R928 Other abnormal and inconclusive findings on diagnostic imaging of breast: Secondary | ICD-10-CM

## 2018-05-06 ENCOUNTER — Other Ambulatory Visit: Payer: Self-pay

## 2018-05-06 ENCOUNTER — Ambulatory Visit (AMBULATORY_SURGERY_CENTER): Payer: Self-pay | Admitting: *Deleted

## 2018-05-06 ENCOUNTER — Encounter: Payer: Self-pay | Admitting: Internal Medicine

## 2018-05-06 VITALS — Ht 61.0 in | Wt 125.0 lb

## 2018-05-06 DIAGNOSIS — Z1211 Encounter for screening for malignant neoplasm of colon: Secondary | ICD-10-CM

## 2018-05-06 NOTE — Progress Notes (Signed)
No egg or soy allergy known to patient  No issues with past sedation with any surgeries  or procedures, no intubation problems  No diet pills per patient No home 02 use per patient  No blood thinners per patient  Pt denies issues with constipation  No A fib or A flutter  EMMI video sent to pt's e mail  

## 2018-05-20 ENCOUNTER — Encounter: Payer: BLUE CROSS/BLUE SHIELD | Admitting: Internal Medicine

## 2018-06-29 ENCOUNTER — Other Ambulatory Visit: Payer: Self-pay | Admitting: Family Medicine

## 2018-06-29 MED ORDER — PREDNISONE 10 MG PO TABS: ORAL_TABLET | ORAL | 0 refills | Status: DC

## 2018-06-29 NOTE — Telephone Encounter (Signed)
Requested medication (s) are due for refill today: n/a  Requested medication (s) are on the active medication list: Yes  Last refill:  08/07/17  Future visit scheduled: No  Notes to clinic:  Pt. Has poison ivy on right cheek, wrists and neck under chin.    Requested Prescriptions  Pending Prescriptions Disp Refills   predniSONE (DELTASONE) 10 MG tablet 40 tablet 0    Sig: Take 4 pills x 4 days then 3 pills x 4 days then 2 pills x 4 days then 1 pill x 4 days     Not Delegated - Endocrinology:  Oral Corticosteroids Failed - 06/29/2018 10:58 AM      Failed - This refill cannot be delegated      Passed - Last BP in normal range    BP Readings from Last 1 Encounters:  03/23/18 116/80         Passed - Valid encounter within last 6 months    Recent Outpatient Visits          3 months ago Physical exam   [facility], [facility] at [facility], Alinda Sierras, MD   10 months ago Allergy to [place] [facility], MD   10 months ago Contact dermatitis due to [place] [facility], Ebony Hail, MD   11 months ago Otalgia, left   [facility]rap[facility] at [facility], Alinda Sierras, MD   1 year ago [place] [facility], Alinda Sierras, MD

## 2018-06-29 NOTE — Telephone Encounter (Signed)
This is NOT on current medication list.  Please advise. Doxy visit?

## 2018-06-29 NOTE — Telephone Encounter (Signed)
Set up Doxy

## 2018-06-29 NOTE — Telephone Encounter (Signed)
Patient has a Doxy appointment on 06/30/18 at 9:15am

## 2018-06-29 NOTE — Telephone Encounter (Signed)
Patient states she has poison ivy again and needs her prednisone sent back in

## 2018-06-30 ENCOUNTER — Other Ambulatory Visit: Payer: Self-pay

## 2018-06-30 ENCOUNTER — Ambulatory Visit (INDEPENDENT_AMBULATORY_CARE_PROVIDER_SITE_OTHER): Payer: BLUE CROSS/BLUE SHIELD | Admitting: Family Medicine

## 2018-06-30 DIAGNOSIS — L237 Allergic contact dermatitis due to plants, except food: Secondary | ICD-10-CM

## 2018-06-30 NOTE — Progress Notes (Signed)
Patient ID: Ruth Wong, female   DOB: 1970/08/25, 48 y.o.   MRN: [id number]  This visit type was conducted due to national recommendations for restrictions regarding the COVID-19 pandemic in an effort to limit this patient's exposure and mitigate transmission in our community.   Virtual Visit via Video Note  I connected with@ on 06/30/18 at  9:15 AM EDT by a video enabled telemedicine application and verified that I am speaking with the correct person using two identifiers.  Location patient: home Location provider:work or home office Persons participating in the virtual visit: patient, provider  I discussed the limitations of evaluation and management by telemedicine and the availability of in person appointments. The patient expressed understanding and agreed to proceed.   HPI:  Patient had onset of pruritic rash last Friday.  She has long history of contact dermatitis.  She has been working outside a lot lately.  She has involvement of her forearms and she was concerned because of some involvement around the right eye and face yesterday.  She has some vesicles which are pruritic.  No pain.  No fevers or chills.  She has done well with prednisone in the past.  She tried some topicals over-the-counter without much relief  ROS: See pertinent positives and negatives per HPI.  No past medical history on file.  Past Surgical History:  Procedure Laterality Date  . LAPAROSCOPY  1987  . WISDOM TOOTH EXTRACTION      Family History  Problem Relation Age of Onset  . Diabetes Mother   . Heart disease Mother 39       cabg  . Stroke Father   . Hypertension Father   . Cancer Paternal Uncle        colon  . Colon cancer Paternal Uncle 31       undiagnosed colon cancer- died 63  . Cancer Paternal Grandmother        colon  . Colon cancer Paternal Grandmother   . Colon polyps Brother   . Esophageal cancer Neg Hx   . Rectal cancer Neg Hx   . Stomach cancer Neg Hx     SOCIAL HX: Past  history of smoking   Current Outpatient Medications:  .  bisacodyl (DULCOLAX) 5 MG EC tablet, Take 5 mg by mouth once. X 4 for colon prep 3-18, Disp: , Rfl:  .  ibuprofen (ADVIL,MOTRIN) 200 MG tablet, Take 200 mg by mouth every 6 (six) hours as needed., Disp: , Rfl:  .  polyethylene glycol powder (MIRALAX) powder, Take 1 Container by mouth once. 238 grams for colon 3-18, Disp: , Rfl:  .  predniSONE (DELTASONE) 10 MG tablet, Take 4 pills x 4 days then 3 pills x 4 days then 2 pills x 4 days then 1 pill x 4 days, Disp: 40 tablet, Rfl: 0 .  triamcinolone cream (KENALOG) 0.1 %, Apply 1 application topically 2 (two) times daily. (Patient not taking: Reported on 03/23/2018), Disp: 30 g, Rfl: 0 .  valACYclovir (VALTREX) 1000 MG tablet, Two tablets twice daily for one day prn cold sores, Disp: 30 tablet, Rfl: 0  EXAM:  VITALS per patient if applicable:  GENERAL: alert, oriented, appears well and in no acute distress  HEENT: atraumatic, conjunttiva clear, no obvious abnormalities on inspection of external nose and ears  NECK: normal movements of the head and neck  LUNGS: on inspection no signs of respiratory distress, breathing rate appears normal, no obvious gross SOB, gasping or wheezing  CV: no obvious  cyanosis  MS: moves all visible extremities without noticeable abnormality  PSYCH/NEURO: pleasant and cooperative, no obvious depression or anxiety, speech and thought processing grossly intact  Skin exam.  We saw some linear vesicles and smaller vesicles on the forearms consistent with contact dermatitis  ASSESSMENT AND PLAN:  Discussed the following assessment and plan:  Allergic contact dermatitis due to plants, except food  -Prednisone taper was called in yesterday. -Follow-up for any persistent or worsening rash    I discussed the assessment and treatment plan with the patient. The patient was provided an opportunity to ask questions and all were answered. The patient agreed with  the plan and demonstrated an understanding of the instructions.   The patient was advised to call back or seek an in-person evaluation if the symptoms worsen or if the condition fails to improve as anticipated.   Carolann Littler, MD

## 2018-07-17 ENCOUNTER — Other Ambulatory Visit: Payer: Self-pay

## 2018-07-17 ENCOUNTER — Ambulatory Visit (INDEPENDENT_AMBULATORY_CARE_PROVIDER_SITE_OTHER): Payer: BLUE CROSS/BLUE SHIELD | Admitting: Family Medicine

## 2018-07-17 ENCOUNTER — Ambulatory Visit: Payer: Self-pay | Admitting: *Deleted

## 2018-07-17 DIAGNOSIS — L237 Allergic contact dermatitis due to plants, except food: Secondary | ICD-10-CM | POA: Diagnosis not present

## 2018-07-17 MED ORDER — PREDNISONE 10 MG PO TABS: ORAL_TABLET | ORAL | 0 refills | Status: DC

## 2018-07-17 NOTE — Telephone Encounter (Signed)
Patient called to say that she have poison oak on her face and on her neck. Also say she have a slight headache had some blurred vision last night. She is asking for a call back with what to do. Ph# [phone] Patient is reporting re exposure helping neighbor with her flower bed. Call to office to set up appointment with PCP to discuss treatment. Reason for Disposition . [1] Severe poison ivy, oak, or sumac reaction in the past AND [2] face involved  Answer Assessment - Initial Assessment Questions 1. APPEARANCE of RASH: "Describe the rash."      Red and raised areas- itching- flaring 2. LOCATION: "Where is the rash located?"      Nose, half of chin, side of left face- 1 inch from eye- mostly on the left, on side of neck-R under lip 3. SIZE: "How large is the rash?"      Patchy areas 4. ONSET: "When did the rash begin?"      Started- late Tuesday/Wednesday 5. ITCHING: "Does the rash itch?" If so, ask: "How bad is it?"   - MILD - doesn't interfere with normal activities   - MODERATE - SEVERE: interferes with work, school, sleep, or other activities      Moderate/severe- using ice and benadryl and topicals 6. PREGNANCY: "Is there any chance you are pregnant?" "When was your last menstrual period?"     N/a- IUD  Protocols used: POISON IVY - OAK - SUMAC-A-AH

## 2018-07-17 NOTE — Progress Notes (Signed)
Patient ID: Ruth Wong, female   DOB: [birth year], 48 y.o.   MRN: [id number]  This visit type was conducted due to national recommendations for restrictions regarding the COVID-19 pandemic in an effort to limit this patient's exposure and mitigate transmission in our community.   Virtual Visit via Video Note  I connected with Ervin Knack on 07/17/18 at  9:30 AM EDT by a video enabled telemedicine application and verified that I am speaking with the correct person using two identifiers.  Location patient: home Location provider:work or home office Persons participating in the virtual visit: patient, provider  I discussed the limitations of evaluation and management by telemedicine and the availability of in person appointments. The patient expressed understanding and agreed to proceed.   HPI: Patient had recent encounter for contact dermatitis.  Her previous rash is almost fully cleared.  She then helped someone do some yard work last weekend on Sunday.  By Monday and Tuesday she was breaking out left face and neck and now has some patches on the right face as well.  No relief with over-the-counter medications.  Rash is pruritic.  Nonpainful.  No fevers or chills.  Very sensitive to poison ivy   ROS: See pertinent positives and negatives per HPI.  No past medical history on file.  Past Surgical History:  Procedure Laterality Date  . LAPAROSCOPY  1987  . WISDOM TOOTH EXTRACTION      Family History  Problem Relation Age of Onset  . Diabetes Mother   . Heart disease Mother 51       cabg  . Stroke Father   . Hypertension Father   . Cancer Paternal Uncle        colon  . Colon cancer Paternal Uncle 31       undiagnosed colon cancer- died 67  . Cancer Paternal Grandmother        colon  . Colon cancer Paternal Grandmother   . Colon polyps Brother   . Esophageal cancer Neg Hx   . Rectal cancer Neg Hx   . Stomach cancer Neg Hx     SOCIAL HX: Occasional nicotine use   Current  Outpatient Medications:  .  bisacodyl (DULCOLAX) 5 MG EC tablet, Take 5 mg by mouth once. X 4 for colon prep 3-18, Disp: , Rfl:  .  ibuprofen (ADVIL,MOTRIN) 200 MG tablet, Take 200 mg by mouth every 6 (six) hours as needed., Disp: , Rfl:  .  polyethylene glycol powder (MIRALAX) powder, Take 1 Container by mouth once. 238 grams for colon 3-18, Disp: , Rfl:  .  predniSONE (DELTASONE) 10 MG tablet, Taper as follows: 6-6-4-4-4-3-3-3-2-2-1-1, Disp: 39 tablet, Rfl: 0 .  triamcinolone cream (KENALOG) 0.1 %, Apply 1 application topically 2 (two) times daily. (Patient not taking: Reported on 03/23/2018), Disp: 30 g, Rfl: 0 .  valACYclovir (VALTREX) 1000 MG tablet, Two tablets twice daily for one day prn cold sores, Disp: 30 tablet, Rfl: 0  EXAM:  VITALS per patient if applicable:  GENERAL: alert, oriented, appears well and in no acute distress  HEENT: atraumatic, conjunttiva clear, no obvious abnormalities on inspection of external nose and ears  NECK: normal movements of the head and neck  LUNGS: on inspection no signs of respiratory distress, breathing rate appears normal, no obvious gross SOB, gasping or wheezing  CV: no obvious cyanosis  MS: moves all visible extremities without noticeable abnormality  PSYCH/NEURO: pleasant and cooperative, no obvious depression or anxiety, speech and thought processing grossly intact  ASSESSMENT AND PLAN:  Discussed the following assessment and plan:  Allergic contact dermatitis due to plants, except food  -We sent in another taper of prednisone.  Reviewed potential side effects.  Follow-up for any persistent or worsening rash.     I discussed the assessment and treatment plan with the patient. The patient was provided an opportunity to ask questions and all were answered. The patient agreed with the plan and demonstrated an understanding of the instructions.   The patient was advised to call back or seek an in-person evaluation if the symptoms worsen  or if the condition fails to improve as anticipated.   Carolann Littler, MD

## 2018-08-27 DIAGNOSIS — R928 Other abnormal and inconclusive findings on diagnostic imaging of breast: Secondary | ICD-10-CM | POA: Diagnosis not present

## 2018-08-27 DIAGNOSIS — N6002 Solitary cyst of left breast: Secondary | ICD-10-CM | POA: Diagnosis not present

## 2018-08-28 ENCOUNTER — Other Ambulatory Visit: Payer: Self-pay | Admitting: Family Medicine

## 2018-08-28 ENCOUNTER — Other Ambulatory Visit: Payer: Self-pay | Admitting: Gynecology

## 2018-08-28 DIAGNOSIS — N63 Unspecified lump in unspecified breast: Secondary | ICD-10-CM

## 2018-09-02 ENCOUNTER — Other Ambulatory Visit: Payer: Self-pay

## 2018-09-02 ENCOUNTER — Ambulatory Visit
Admission: RE | Admit: 2018-09-02 | Discharge: 2018-09-02 | Disposition: A | Discharge status: Home / Self Care | Payer: BLUE CROSS/BLUE SHIELD | Source: Ambulatory Visit | Attending: Family Medicine | Admitting: Family Medicine

## 2018-09-02 ENCOUNTER — Ambulatory Visit
Admission: RE | Admit: 2018-09-02 | Discharge: 2018-09-02 | Disposition: A | Discharge status: Home / Self Care | Payer: BC Managed Care – PPO | Source: Ambulatory Visit | Attending: Family Medicine | Admitting: Family Medicine

## 2018-09-02 ENCOUNTER — Other Ambulatory Visit: Payer: Self-pay | Admitting: Family Medicine

## 2018-09-02 DIAGNOSIS — N6002 Solitary cyst of left breast: Secondary | ICD-10-CM

## 2018-09-02 DIAGNOSIS — R922 Inconclusive mammogram: Secondary | ICD-10-CM | POA: Diagnosis not present

## 2018-09-02 DIAGNOSIS — N63 Unspecified lump in unspecified breast: Secondary | ICD-10-CM

## 2019-04-29 DIAGNOSIS — A609 Anogenital herpesviral infection, unspecified: Secondary | ICD-10-CM | POA: Diagnosis not present

## 2019-04-29 DIAGNOSIS — Z130 Encounter for screening for diseases of the blood and blood-forming organs and certain disorders involving the immune mechanism: Secondary | ICD-10-CM | POA: Diagnosis not present

## 2019-04-29 DIAGNOSIS — Z01419 Encounter for gynecological examination (general) (routine) without abnormal findings: Secondary | ICD-10-CM | POA: Diagnosis not present

## 2019-04-29 DIAGNOSIS — Z30431 Encounter for routine checking of intrauterine contraceptive device: Secondary | ICD-10-CM | POA: Diagnosis not present

## 2019-05-04 DIAGNOSIS — Z1231 Encounter for screening mammogram for malignant neoplasm of breast: Secondary | ICD-10-CM | POA: Diagnosis not present

## 2019-05-11 DIAGNOSIS — D485 Neoplasm of uncertain behavior of skin: Secondary | ICD-10-CM | POA: Diagnosis not present

## 2019-05-11 DIAGNOSIS — L570 Actinic keratosis: Secondary | ICD-10-CM | POA: Diagnosis not present

## 2019-05-17 ENCOUNTER — Encounter: Payer: Self-pay | Admitting: Gastroenterology

## 2019-05-25 IMAGING — US ULTRASOUND LEFT BREAST LIMITED
1 series · 5 of 5 positions shown · non-contrast
Comparison: Previous exam(s).

CLINICAL DATA: Callback from screening mammogram for possible mass
left breast.

EXAM:
ULTRASOUND OF THE LEFT BREAST

[Series 1: ultrasound left breast limited · 0.07mm/px · 5 of 5 slices shown]
[im 1/5]
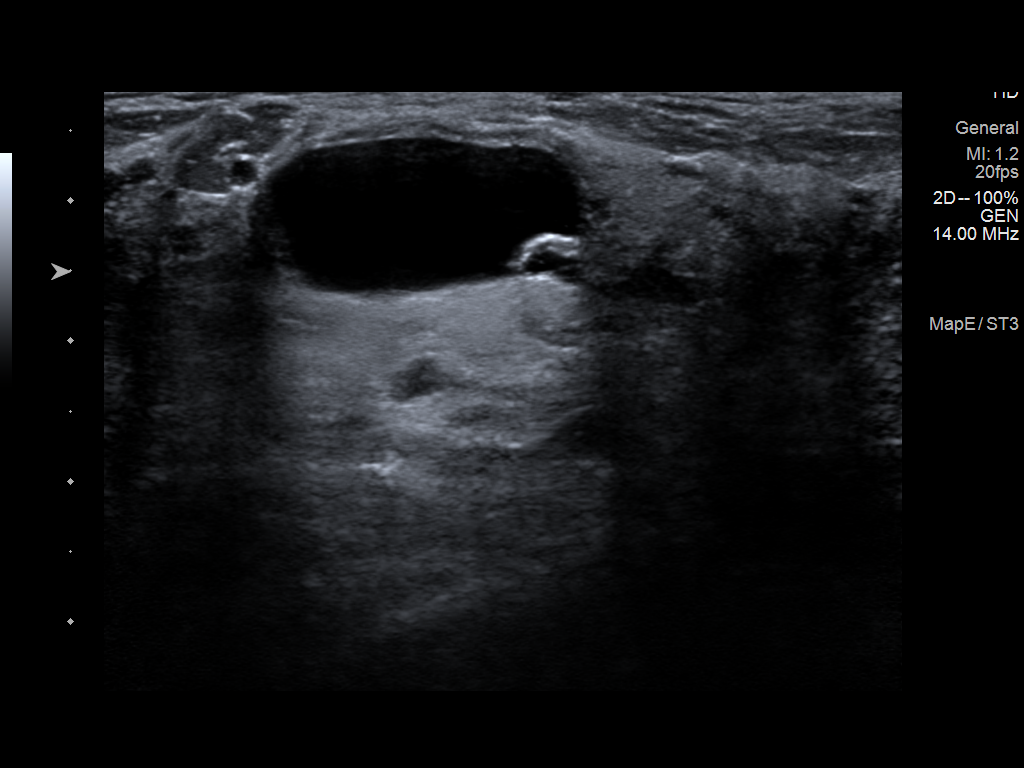
[im 2/5]
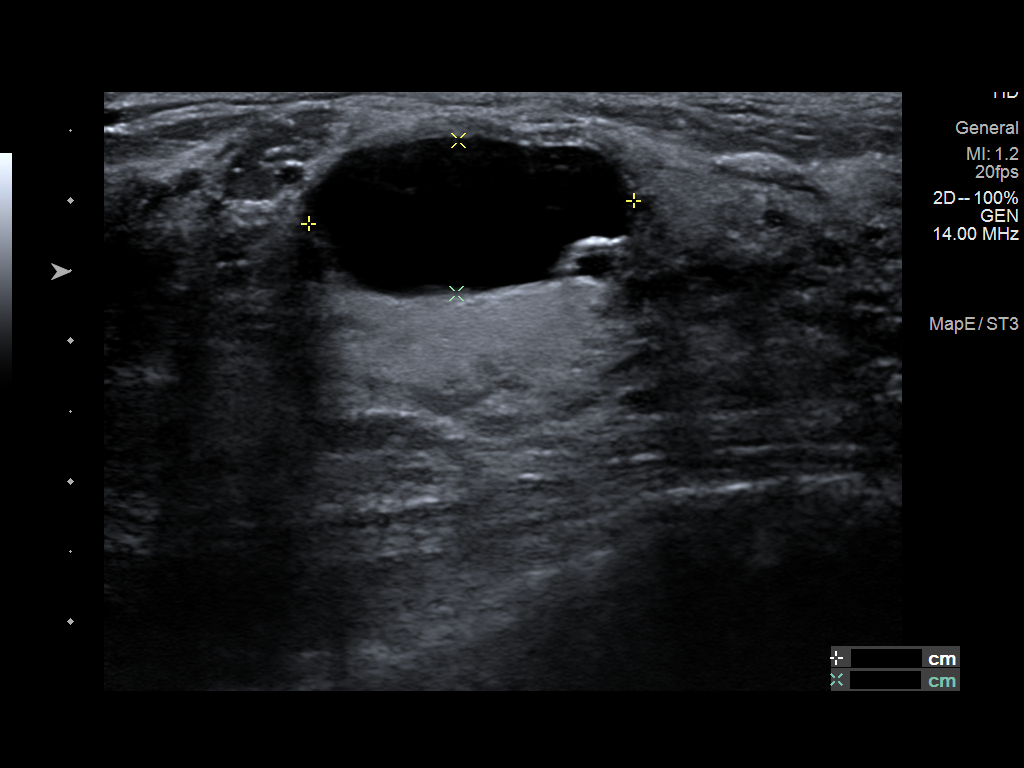
[im 3/5]
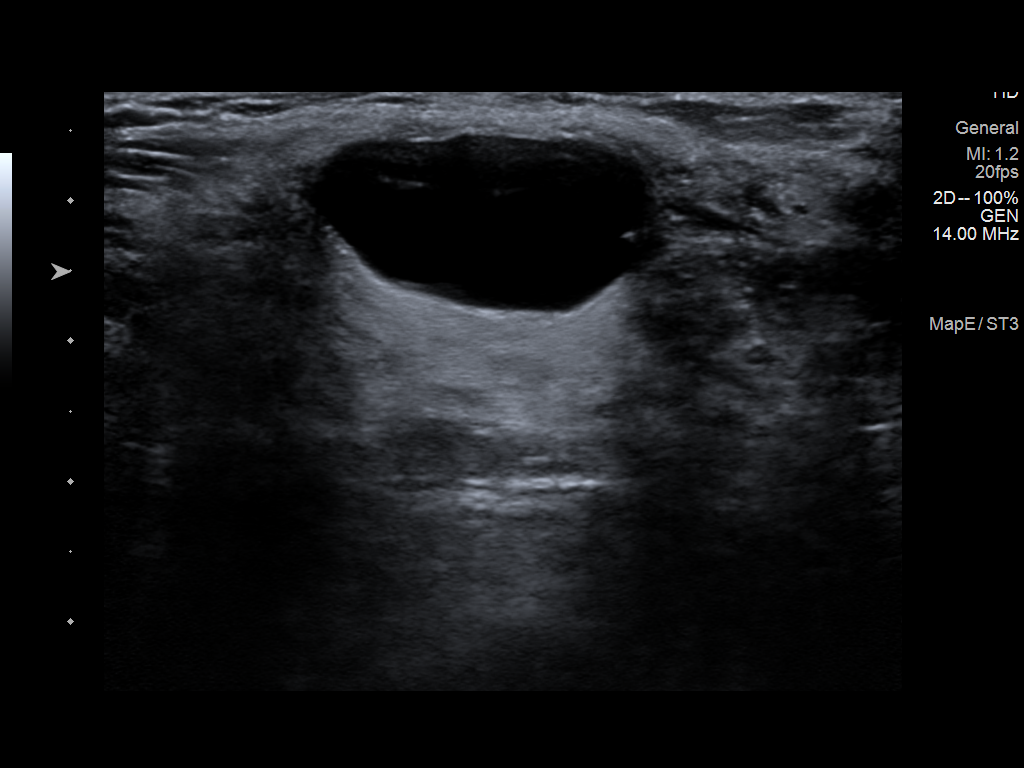
[im 4/5]
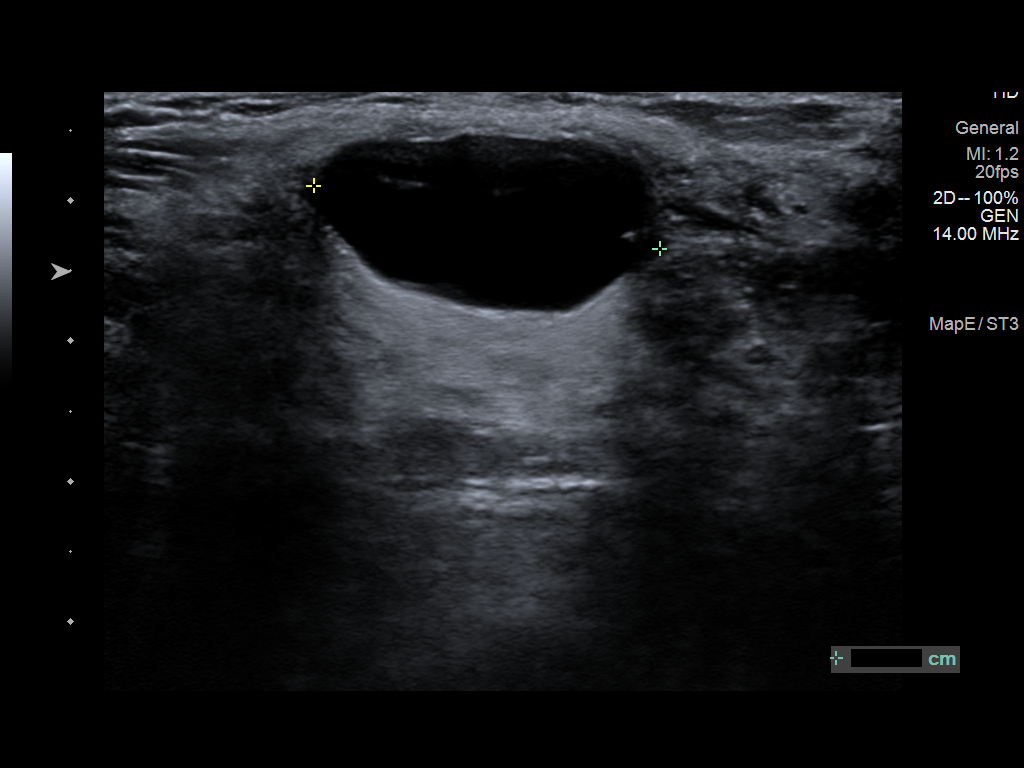
[im 5/5]
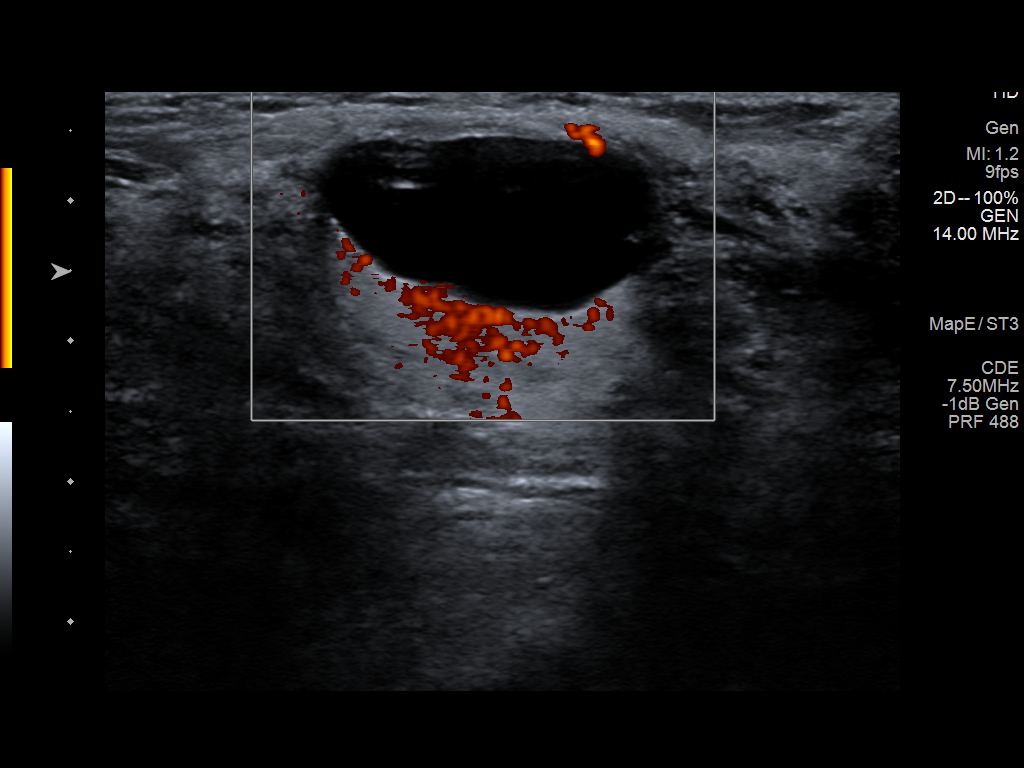

[5 of 5 positions shown; findings below may reference images not displayed]

FINDINGS: Targeted ultrasound is performed, showing a 2.3 x 1.1 x 2.5 cm cyst
with minimal septation at the left breast 10 o'clock 5 cm from
nipple correlating to the mammographic finding.
IMPRESSION: Benign findings.

RECOMMENDATION:
Routine screening mammogram back on schedule.

I have discussed the findings and recommendations with the patient.
Results were also provided in writing at the conclusion of the
visit. If applicable, a reminder letter will be sent to the patient
regarding the next appointment.

BI-RADS CATEGORY  2: Benign.

## 2019-06-15 ENCOUNTER — Ambulatory Visit (AMBULATORY_SURGERY_CENTER): Payer: Self-pay

## 2019-06-15 ENCOUNTER — Other Ambulatory Visit: Payer: Self-pay

## 2019-06-15 VITALS — Temp 97.5°F | Ht 61.0 in | Wt 124.4 lb

## 2019-06-15 DIAGNOSIS — Z01818 Encounter for other preprocedural examination: Secondary | ICD-10-CM

## 2019-06-15 DIAGNOSIS — Z1211 Encounter for screening for malignant neoplasm of colon: Secondary | ICD-10-CM

## 2019-06-15 MED ORDER — NA SULFATE-K SULFATE-MG SULF 17.5-3.13-1.6 GM/177ML PO SOLN: 1.0000 | Freq: Once | ORAL | 0 refills | Status: AC

## 2019-06-15 NOTE — Progress Notes (Signed)
No allergies to soy or egg Pt is not on blood thinners or diet pills Denies issues with sedation/intubation Denies atrial flutter/fib Denies constipation   Emmi instructions given to pt  Pt is aware of Covid safety and care partner requirements.  

## 2019-06-24 ENCOUNTER — Ambulatory Visit (INDEPENDENT_AMBULATORY_CARE_PROVIDER_SITE_OTHER): Payer: BC Managed Care – PPO

## 2019-06-24 ENCOUNTER — Other Ambulatory Visit: Payer: Self-pay | Admitting: Gastroenterology

## 2019-06-24 DIAGNOSIS — Z1159 Encounter for screening for other viral diseases: Secondary | ICD-10-CM

## 2019-06-24 LAB — SARS CORONAVIRUS 2 (TAT 6-24 HRS): SARS Coronavirus 2: NEGATIVE

## 2019-06-25 ENCOUNTER — Encounter: Payer: Self-pay | Admitting: Gastroenterology

## 2019-06-29 ENCOUNTER — Encounter: Payer: Self-pay | Admitting: Gastroenterology

## 2019-06-29 ENCOUNTER — Ambulatory Visit (AMBULATORY_SURGERY_CENTER): Payer: BC Managed Care – PPO | Admitting: Gastroenterology

## 2019-06-29 ENCOUNTER — Other Ambulatory Visit: Payer: Self-pay

## 2019-06-29 VITALS — BP 101/70 | HR 58 | Temp 97.1°F | Resp 13 | Ht 61.0 in | Wt 124.0 lb

## 2019-06-29 DIAGNOSIS — D128 Benign neoplasm of rectum: Secondary | ICD-10-CM

## 2019-06-29 DIAGNOSIS — Z1211 Encounter for screening for malignant neoplasm of colon: Secondary | ICD-10-CM

## 2019-06-29 DIAGNOSIS — Z800 Family history of malignant neoplasm of digestive organs: Secondary | ICD-10-CM | POA: Diagnosis not present

## 2019-06-29 DIAGNOSIS — K5140 Inflammatory polyps of colon without complications: Secondary | ICD-10-CM

## 2019-06-29 DIAGNOSIS — K621 Rectal polyp: Secondary | ICD-10-CM

## 2019-06-29 DIAGNOSIS — D122 Benign neoplasm of ascending colon: Secondary | ICD-10-CM

## 2019-06-29 DIAGNOSIS — K649 Unspecified hemorrhoids: Secondary | ICD-10-CM

## 2019-06-29 DIAGNOSIS — K5730 Diverticulosis of large intestine without perforation or abscess without bleeding: Secondary | ICD-10-CM

## 2019-06-29 MED ORDER — SODIUM CHLORIDE 0.9 % IV SOLN: 500.0000 mL | Freq: Once | INTRAVENOUS | Status: DC

## 2019-06-29 NOTE — Progress Notes (Signed)
Called to room to assist during endoscopic procedure.  Patient ID and intended procedure confirmed with present staff. Received instructions for my participation in the procedure from the performing physician.  

## 2019-06-29 NOTE — Patient Instructions (Signed)
Handouts provided:  Polyps and Diverticulosis  YOU HAD AN ENDOSCOPIC PROCEDURE TODAY AT THE [facility]:   Refer to the procedure report that was given to you for any specific questions about what was found during the examination.  If the procedure report does not answer your questions, please call your gastroenterologist to clarify.  If you requested that your care partner not be given the details of your procedure findings, then the procedure report has been included in a sealed envelope for you to review at your convenience later.  YOU SHOULD EXPECT: Some feelings of bloating in the abdomen. Passage of more gas than usual.  Walking can help get rid of the air that was put into your GI tract during the procedure and reduce the bloating. If you had a lower endoscopy (such as a colonoscopy or flexible sigmoidoscopy) you may notice spotting of blood in your stool or on the toilet paper. If you underwent a bowel prep for your procedure, you may not have a normal bowel movement for a few days.  Please Note:  You might notice some irritation and congestion in your nose or some drainage.  This is from the oxygen used during your procedure.  There is no need for concern and it should clear up in a day or so.  SYMPTOMS TO REPORT IMMEDIATELY:  Following lower endoscopy (colonoscopy or flexible sigmoidoscopy):  Excessive amounts of blood in the stool  Significant tenderness or worsening of abdominal pains  Swelling of the abdomen that is new, acute  Fever of 100F or higher  For urgent or emergent issues, a gastroenterologist can be reached at any hour by calling [phone]. Do not use MyChart messaging for urgent concerns.    DIET:  We do recommend a small meal at first, but then you may proceed to your regular diet.  Drink plenty of fluids but you should avoid alcoholic beverages for 24 hours.  ACTIVITY:  You should plan to take it easy for the rest of today and you should NOT DRIVE  or use heavy machinery until tomorrow (because of the sedation medicines used during the test).    FOLLOW UP: Our staff will call the number listed on your records 48-72 hours following your procedure to check on you and address any questions or concerns that you may have regarding the information given to you following your procedure. If we do not reach you, we will leave a message.  We will attempt to reach you two times.  During this call, we will ask if you have developed any symptoms of COVID 19. If you develop any symptoms (ie: fever, flu-like symptoms, shortness of breath, cough etc.) before then, please call [phone].  If you test positive for Covid 19 in the 2 weeks post procedure, please call and report this information to us.    If any biopsies were taken you will be contacted by phone or by letter within the next 1-3 weeks.  Please call us at [phone] if you have not heard about the biopsies in 3 weeks.    SIGNATURES/CONFIDENTIALITY: You and/or your care partner have signed paperwork which will be entered into your electronic medical record.  These signatures attest to the fact that that the information above on your After Visit Summary has been reviewed and is understood.  Full responsibility of the confidentiality of this discharge information lies with you and/or your care-partner.  

## 2019-06-29 NOTE — Op Note (Addendum)
[place] Patient Name: Ruth Wong Procedure Date: 06/29/2019 8:31 AM MRN: [id number] Endoscopist: Mauri Pole , MD Age: 49 Referring MD:  Date of Birth: [birth year] Gender: Female Account #: [id number] Procedure:                Colonoscopy Indications:              Screening for colorectal malignant neoplasm, Colon                            cancer screening in patient at increased risk:                            Family history of colorectal cancer in multiple 2nd                            degree relatives Medicines:                Monitored Anesthesia Care Procedure:                Pre-Anesthesia Assessment:                           - Prior to the procedure, a History and Physical                            was performed, and patient medications and                            allergies were reviewed. The patient's tolerance of                            previous anesthesia was also reviewed. The risks                            and benefits of the procedure and the sedation                            options and risks were discussed with the patient.                            All questions were answered, and informed consent                            was obtained. Prior Anticoagulants: The patient has                            taken no previous anticoagulant or antiplatelet                            agents. ASA Grade Assessment: I - A normal, healthy                            patient. After reviewing the risks and benefits,  the patient was deemed in satisfactory condition to                            undergo the procedure.                           After obtaining informed consent, the colonoscope                            was passed under direct vision. Throughout the                            procedure, the patient's blood pressure, pulse, and                            oxygen saturations were monitored continuously. The                         Colonoscope was introduced through the anus and                            advanced to the the cecum, identified by                            appendiceal orifice and ileocecal valve. The                            colonoscopy was performed without difficulty. The                            patient tolerated the procedure well. The quality                            of the bowel preparation was adequate to identify                            polyps after extensive lavage and suction. The                            ileocecal valve, appendiceal orifice, and rectum                            were photographed. Scope In: 8:47:56 AM Scope Out: 9:07:48 AM Scope Withdrawal Time: 0 hours 13 minutes 19 seconds  Total Procedure Duration: 0 hours 19 minutes 52 seconds  Findings:                 The perianal and digital rectal examinations were                            normal.                           A 1 mm polyp was found in the ascending colon. The  polyp was sessile. The polyp was removed with a                            cold biopsy forceps. Resection and retrieval were                            complete.                           A 4 mm polyp was found in the rectum. The polyp was                            sessile. The polyp was removed with a cold snare.                            Resection and retrieval were complete.                           A few small-mouthed diverticula were found in the                            sigmoid colon and ascending colon.                           Non-bleeding internal hemorrhoids were found during                            retroflexion. The hemorrhoids were small. Complications:            No immediate complications. Estimated Blood Loss:     Estimated blood loss was minimal. Impression:               - One 1 mm polyp in the ascending colon, removed                            with a cold biopsy forceps.  Resected and retrieved.                           - One 4 mm polyp in the rectum, removed with a cold                            snare. Resected and retrieved.                           - Diverticulosis in the sigmoid colon and in the                            ascending colon.                           - Non-bleeding internal hemorrhoids. Recommendation:           - Patient has a contact number available for  emergencies. The signs and symptoms of potential                            delayed complications were discussed with the                            patient. Return to normal activities tomorrow.                            Written discharge instructions were provided to the                            patient.                           - Resume previous diet.                           - Continue present medications.                           - Await pathology results.                           - Repeat colonoscopy in 5-10 years for surveillance                            based on pathology results.                           - For future colonoscopy the patient will require                            to follow all the instructions for the preparation                            without any deviation. If there are any questions,                            please contact the gastroenterologist. Mauri Pole, MD 06/29/2019 9:14:00 AM This report has been signed electronically.

## 2019-06-29 NOTE — Progress Notes (Signed)
pt tolerated well. VSS. awake and to recovery. Report given to RN.  

## 2019-06-29 NOTE — Progress Notes (Signed)
Pt's states no medical or surgical changes since previsit or office visit. 

## 2019-07-01 ENCOUNTER — Telehealth: Payer: Self-pay

## 2019-07-01 NOTE — Telephone Encounter (Signed)
  Follow up Call-  Call back number 06/29/2019  Post procedure Call Back phone  # [phone]  Permission to leave phone message Yes  Some recent data might be hidden     Patient questions:  Do you have a fever, pain , or abdominal swelling? No. Pain Score  0 *  Have you tolerated food without any problems? Yes.    Have you been able to return to your normal activities? Yes.    Do you have any questions about your discharge instructions: Diet   No. Medications  No. Follow up visit  No.  Do you have questions or concerns about your Care? No.  Actions: * If pain score is 4 or above: No action needed, pain <4.  1. Have you developed a fever since your procedure? no  2.   Have you had an respiratory symptoms (SOB or cough) since your procedure? no  3.   Have you tested positive for COVID 19 since your procedure no  4.   Have you had any family members/close contacts diagnosed with the COVID 19 since your procedure?  no   If yes to any of these questions please route to Joylene John, RN and Erenest Rasher, RN

## 2019-07-06 ENCOUNTER — Encounter: Payer: Self-pay | Admitting: Gastroenterology

## 2019-07-16 DIAGNOSIS — Q826 Congenital sacral dimple: Secondary | ICD-10-CM | POA: Diagnosis not present

## 2020-03-07 ENCOUNTER — Telehealth (INDEPENDENT_AMBULATORY_CARE_PROVIDER_SITE_OTHER): Payer: BC Managed Care – PPO | Admitting: Family Medicine

## 2020-03-07 DIAGNOSIS — R630 Anorexia: Secondary | ICD-10-CM | POA: Diagnosis not present

## 2020-03-07 NOTE — Progress Notes (Signed)
Patient ID: Ruth Wong, female   DOB: [birth year], 50 y.o.   MRN: [id number]  This visit type was conducted due to national recommendations for restrictions regarding the COVID-19 pandemic in an effort to limit this patient's exposure and mitigate transmission in our community.   Virtual Visit via Telephone Note  I connected with Ruth Wong on 03/07/20 at  4:15 PM EST by telephone and verified that I am speaking with the correct person using two identifiers.   I discussed the limitations, risks, security and privacy concerns of performing an evaluation and management service by telephone and the availability of in person appointments. I also discussed with the patient that there may be a patient responsible charge related to this service. The patient expressed understanding and agreed to proceed.  Location patient: home Location provider: work or home office Participants present for the call: patient, provider Patient did not have a visit in the prior 7 days to address this/these issue(s).   History of Present Illness: Ruth Wong relates 1 week history of slightly diminished appetite.  Her concern specifically was that her father-in-law has been battling with C. difficile colitis for the past several months and he was around his wife.  Ruth Wong denies any diarrhea.  She has not had any fever.  No nausea or vomiting.  No localizing abdominal pain.  She states her weight is relatively stable.  No dysuria. Ruth Wong is generally healthy and has no significant chronic medical problems  Past Medical History:  Diagnosis Date  . Allergy    seasonal   Past Surgical History:  Procedure Laterality Date  . LAPAROSCOPY  1987  . WISDOM TOOTH EXTRACTION      reports that she has been smoking. She has smoked for the past 10.00 years. She has never used smokeless tobacco. She reports current alcohol use. She reports current drug use. Frequency: 1.00 time per week. Drug: Marijuana. family history includes Cancer in  her paternal grandmother and paternal uncle; Colon cancer in her paternal grandmother; Colon cancer (age of onset: 35) in her paternal uncle; Colon polyps in her brother; Diabetes in her mother; Heart disease (age of onset: 70) in her mother; Hypertension in her father; Stroke in her father. No Known Allergies    Observations/Objective: Patient sounds cheerful and well on the phone. I do not appreciate any SOB. Speech and thought processing are grossly intact. Patient reported vitals:  Assessment and Plan:  Decreased appetite of 1 week duration.  Patient denies any specific concerns such as recurrent vomiting, nausea, fever, abdominal pain.  Her concern was exposure to someone who is a caregiver for someone with C. difficile.  However, patient has no symptoms that are suggestive such as diarrhea, bloody stools, fever, etc.  -We stressed the importance of good handwashing and follow-up promptly for any diarrhea-especially several watery stools per day -We have also recommended office follow-up if her appetite is not improving over the next week and sooner if she develops any other specific symptoms.  Follow Up Instructions:  -As above   [id number] [id number] [id number] 11-20 [id number] 21-30 I did not refer this patient for an OV in the next 24 hours for this/these issue(s).  I discussed the assessment and treatment plan with the patient. The patient was provided an opportunity to ask questions and all were answered. The patient agreed with the plan and demonstrated an understanding of the instructions.   The patient was advised to call back or seek an in-person evaluation if the symptoms worsen or  if the condition fails to improve as anticipated.  I provided 12 minutes of non-face-to-face time during this encounter.   Carolann Littler, MD

## 2020-05-16 DIAGNOSIS — Z130 Encounter for screening for diseases of the blood and blood-forming organs and certain disorders involving the immune mechanism: Secondary | ICD-10-CM | POA: Diagnosis not present

## 2020-05-16 DIAGNOSIS — Z01419 Encounter for gynecological examination (general) (routine) without abnormal findings: Secondary | ICD-10-CM | POA: Diagnosis not present

## 2020-05-16 DIAGNOSIS — Z1231 Encounter for screening mammogram for malignant neoplasm of breast: Secondary | ICD-10-CM | POA: Diagnosis not present

## 2020-05-16 DIAGNOSIS — Z780 Asymptomatic menopausal state: Secondary | ICD-10-CM | POA: Diagnosis not present

## 2020-05-16 DIAGNOSIS — A6004 Herpesviral vulvovaginitis: Secondary | ICD-10-CM | POA: Diagnosis not present

## 2020-05-16 DIAGNOSIS — Z30432 Encounter for removal of intrauterine contraceptive device: Secondary | ICD-10-CM | POA: Diagnosis not present

## 2020-06-13 ENCOUNTER — Other Ambulatory Visit: Payer: Self-pay | Admitting: Gynecology

## 2020-06-13 DIAGNOSIS — R928 Other abnormal and inconclusive findings on diagnostic imaging of breast: Secondary | ICD-10-CM

## 2020-07-03 ENCOUNTER — Ambulatory Visit
Admission: RE | Admit: 2020-07-03 | Discharge: 2020-07-03 | Disposition: A | Discharge status: Home / Self Care | Payer: BC Managed Care – PPO | Source: Ambulatory Visit | Attending: Gynecology | Admitting: Gynecology

## 2020-07-03 ENCOUNTER — Other Ambulatory Visit: Payer: Self-pay

## 2020-07-03 ENCOUNTER — Ambulatory Visit: Payer: BC Managed Care – PPO

## 2020-07-03 DIAGNOSIS — R928 Other abnormal and inconclusive findings on diagnostic imaging of breast: Secondary | ICD-10-CM

## 2020-07-03 DIAGNOSIS — R922 Inconclusive mammogram: Secondary | ICD-10-CM | POA: Diagnosis not present

## 2020-09-26 ENCOUNTER — Telehealth (INDEPENDENT_AMBULATORY_CARE_PROVIDER_SITE_OTHER): Payer: BC Managed Care – PPO | Admitting: Family Medicine

## 2020-09-26 DIAGNOSIS — J069 Acute upper respiratory infection, unspecified: Secondary | ICD-10-CM | POA: Diagnosis not present

## 2020-09-26 NOTE — Progress Notes (Signed)
Patient ID: Ruth Wong, female   DOB: 1971-03-03, 50 y.o.   MRN: [id number]   This visit type was conducted due to national recommendations for restrictions regarding the COVID-19 pandemic in an effort to limit this patient's exposure and mitigate transmission in our community.   Virtual Visit via Video Note  I connected with Ruth Wong on 09/26/20 at 10:45 AM EDT by a video enabled telemedicine application and verified that I am speaking with the correct person using two identifiers.  Location patient: home Location provider:work or home office Persons participating in the virtual visit: patient, provider  I discussed the limitations of evaluation and management by telemedicine and the availability of in person appointments. The patient expressed understanding and agreed to proceed.   HPI:  Ruth Wong has upper respiratory symptoms.  She takes care of her 90-monthold grandson who also has had upper respiratory symptoms.  Ruth Wong actually had some sinus congestion on the 12th of this month but over the past weekend developed some low-grade fever along with headache and hoarseness and increasing cough.  Her 163-monthld grandson was diagnosed yesterday with influenza A but COVID-negative.  Ruth Wong feels like she is on the "upswing ".  She has had no fever since Saturday and denies any nausea, vomiting, or diarrhea.  Cough is relatively mild.  Has been productive some.  No dyspnea.  No major frontal or maxillary sinus pressure or pain   ROS: See pertinent positives and negatives per HPI.  Past Medical History:  Diagnosis Date   Allergy    seasonal    Past Surgical History:  Procedure Laterality Date   LAPAROSCOPY  1987   WISDOM TOOTH EXTRACTION      Family History  Problem Relation Age of Onset   Diabetes Mother    Heart disease Mother [id number]     cabg   Stroke Father    Hypertension Father    Cancer Paternal Uncle        colon   Colon cancer Paternal Uncle [id number]     undiagnosed colon  cancer- died 3274 Cancer Paternal Grandmother        colon   Colon cancer Paternal Grandmother    Colon polyps Brother    Esophageal cancer Neg Hx    Rectal cancer Neg Hx    Stomach cancer Neg Hx     SOCIAL HX: Non-smoker   Current Outpatient Medications:    ibuprofen (ADVIL,MOTRIN) 200 MG tablet, Take 200 mg by mouth every 6 (six) hours as needed., Disp: , Rfl:    valACYclovir (VALTREX) 1000 MG tablet, Two tablets twice daily for one day prn cold sores, Disp: 30 tablet, Rfl: 0  EXAM:  VITALS per patient if applicable:  GENERAL: alert, oriented, appears well and in no acute distress  HEENT: atraumatic, conjunttiva clear, no obvious abnormalities on inspection of external nose and ears  NECK: normal movements of the head and neck  LUNGS: on inspection no signs of respiratory distress, breathing rate appears normal, no obvious gross SOB, gasping or wheezing  CV: no obvious cyanosis  MS: moves all visible extremities without noticeable abnormality  PSYCH/NEURO: pleasant and cooperative, no obvious depression or anxiety, speech and thought processing grossly intact  ASSESSMENT AND PLAN:  Discussed the following assessment and plan:  URI with cough.  Suspect viral.  Her grandson, whom she cares for, did test positive for influenza A just yesterday but DeS[place]ates her symptoms are already improving and this would be  at least day 4 and so we decided not to use Tamiflu given the circumstances above.  -Continue plenty fluids and rest.  She is using over-the-counter cough medications and expectorants. -Follow-up immediately for any recurrent fever or other concerns     I discussed the assessment and treatment plan with the patient. The patient was provided an opportunity to ask questions and all were answered. The patient agreed with the plan and demonstrated an understanding of the instructions.   The patient was advised to call back or seek an in-person evaluation if the  symptoms worsen or if the condition fails to improve as anticipated.     Carolann Littler, MD

## 2021-03-28 ENCOUNTER — Ambulatory Visit: Payer: BC Managed Care – PPO | Admitting: Family Medicine

## 2021-03-28 VITALS — BP 120/80 | HR 114 | Temp 98.4°F | Wt 130.3 lb

## 2021-03-28 DIAGNOSIS — R350 Frequency of micturition: Secondary | ICD-10-CM | POA: Diagnosis not present

## 2021-03-28 DIAGNOSIS — M545 Low back pain, unspecified: Secondary | ICD-10-CM

## 2021-03-28 LAB — POCT URINALYSIS DIPSTICK
Bilirubin, UA: NEGATIVE
Blood, UA: NEGATIVE
Glucose, UA: NEGATIVE
Ketones, UA: NEGATIVE
Leukocytes, UA: NEGATIVE
Nitrite, UA: NEGATIVE
Protein, UA: NEGATIVE
Spec Grav, UA: 1.025 (ref 1.010–1.025)
Urobilinogen, UA: 0.2 E.U./dL
pH, UA: 6 (ref 5.0–8.0)

## 2021-03-28 NOTE — Progress Notes (Signed)
Established Patient Office Visit  Subjective:  Patient ID: Ruth Wong, female    DOB: [birth year]  Age: 51 y.o. MRN: [id number]  CC:  Chief Complaint  Patient presents with   Urinary Tract Infection    Urinary frequency, low back pain     HPI Ruth Wong presents for the following issues  Urine frequency really for several months.  Some urgency.  No burning with urination.  She has had some low back pain for a few months and was concerned about possible UTI.  She does not drink any caffeine except for small amount of coffee in the mornings and this has been chronic and unchanged.  No regular alcohol use.  No gross hematuria.  She also specifically was concerned because her mom apparently had chronic renal failure.  Her mother though apparently had diabetes, hypertension, and heart failure.  She is not aware of any hereditary kidney disorder such as polycystic kidneys.  [place] denies any recent peripheral edema.  Her low back pain is bilateral lower lumbar.  She is taking care of her 48-month-old grandson 6 days/week and she wonders if some of her pain may be related to lifting him.  She also helps take care of her father.  She has no radiculitis symptoms.  No numbness or weakness lower extremities.  She is had some recent weight gain.  No fever.  No urine or stool incontinence.  Past Medical History:  Diagnosis Date   Allergy    seasonal    Past Surgical History:  Procedure Laterality Date   LAPAROSCOPY  1987   WISDOM TOOTH EXTRACTION      Family History  Problem Relation Age of Onset   Diabetes Mother    Heart disease Mother 40       cabg   Stroke Father    Hypertension Father    Cancer Paternal Uncle        colon   Colon cancer Paternal Uncle 79       undiagnosed colon cancer- died 91   Cancer Paternal Grandmother        colon   Colon cancer Paternal Grandmother    Colon polyps Brother    Esophageal cancer Neg Hx    Rectal cancer Neg Hx    Stomach cancer Neg  Hx     Social History   Socioeconomic History   Marital status: Married    Spouse name: Not on file   Number of children: Not on file   Years of education: Not on file   Highest education level: Not on file  Occupational History   Not on file  Tobacco Use   Smoking status: Some Days    Years: 10.00    Types: Cigarettes   Smokeless tobacco: Never   Tobacco comments:    5-6 a day   Vaping Use   Vaping Use: Former  Substance and Sexual Activity   Alcohol use: Yes    Comment: rare    Drug use: Yes    Frequency: 1.0 times per week    Types: Marijuana   Sexual activity: Not on file  Other Topics Concern   Not on file  Social History Narrative   Not on file   Social Determinants of Health   Financial Resource Strain: Not on file  Food Insecurity: Not on file  Transportation Needs: Not on file  Physical Activity: Not on file  Stress: Not on file  Social Connections: Not on file  Intimate Partner  Violence: Not on file    Outpatient Medications Prior to Visit  Medication Sig Dispense Refill   ibuprofen (ADVIL,MOTRIN) 200 MG tablet Take 200 mg by mouth every 6 (six) hours as needed.     valACYclovir (VALTREX) 1000 MG tablet Two tablets twice daily for one day prn cold sores 30 tablet 0   No facility-administered medications prior to visit.    No Known Allergies  ROS Review of Systems  Constitutional:  Negative for chills and fever.  Genitourinary:  Positive for frequency and urgency. Negative for dysuria, flank pain and hematuria.  Neurological:  Negative for weakness and numbness.     Objective:    Physical Exam Vitals reviewed.  Constitutional:      Appearance: Normal appearance.  Cardiovascular:     Rate and Rhythm: Normal rate and regular rhythm.  Pulmonary:     Effort: Pulmonary effort is normal.     Breath sounds: Normal breath sounds.  Musculoskeletal:     Comments: Right leg raises are negative bilaterally.  No spinal tenderness  Neurological:      General: No focal deficit present.     Mental Status: She is alert.     Cranial Nerves: No cranial nerve deficit.     Comments: Strength full lower extremities.  Symmetric reflexes ankle and knee and 2+ bilaterally    BP 120/80 (BP Location: Left Arm, Patient Position: Sitting, Cuff Size: Normal)    Pulse (!) 114    Temp 98.4 F (36.9 C) (Oral)    Wt 130 lb 4.8 oz (59.1 kg)    SpO2 98%    BMI 24.62 kg/m  Wt Readings from Last 3 Encounters:  03/28/21 130 lb 4.8 oz (59.1 kg)  06/29/19 124 lb (56.2 kg)  06/15/19 124 lb 6.4 oz (56.4 kg)     Health Maintenance Due  Topic Date Due   COVID-19 Vaccine (1) Never done   HIV Screening  Never done   Hepatitis C Screening  Never done   Zoster Vaccines- Shingrix (1 of 2) Never done   MAMMOGRAM  05/10/2018   INFLUENZA VACCINE  10/02/2020   PAP SMEAR-Modifier  04/13/2021    There are no preventive care reminders to display for this patient.  Lab Results  Component Value Date   TSH 1.08 03/24/2018   Lab Results  Component Value Date   WBC 7.6 03/24/2018   HGB 13.7 03/24/2018   HCT 40.7 03/24/2018   MCV 93.8 03/24/2018   PLT 215.0 03/24/2018   Lab Results  Component Value Date   NA 140 03/24/2018   K 4.4 03/24/2018   CO2 29 03/24/2018   GLUCOSE 88 03/24/2018   BUN 11 03/24/2018   CREATININE 0.64 03/24/2018   BILITOT 1.1 03/24/2018   ALKPHOS 50 03/24/2018   AST 14 03/24/2018   ALT 12 03/24/2018   PROT 6.9 03/24/2018   ALBUMIN 4.4 03/24/2018   CALCIUM 9.5 03/24/2018   GFR 98.28 03/24/2018   Lab Results  Component Value Date   CHOL 172 03/24/2018   Lab Results  Component Value Date   HDL 63.90 03/24/2018   Lab Results  Component Value Date   LDLCALC 99 03/24/2018   Lab Results  Component Value Date   TRIG 47.0 03/24/2018   Lab Results  Component Value Date   CHOLHDL 3 03/24/2018   No results found for: HGBA1C    Assessment & Plan:   Problem List Items Addressed This Visit   None Visit Diagnoses  Urinary frequency    -  Primary   Relevant Orders   POCT urinalysis dipstick (Completed)   Bilateral low back pain without sciatica, unspecified chronicity       Relevant Orders   Basic metabolic panel     Patient relates urine frequency.  Urine dipstick is normal.  No evidence for UTI.  She has mild urgency.  We discussed minimizing caffeine use and did discuss medications for urgency but at this point she wishes to observe.  -Regarding her low back pain suspect musculoskeletal.  Nonfocal neuro exam.  We discussed conservative measures with stretches and reviewed proper lifting technique.  Offered nonsteroidals and muscle relaxers but at this point she is wishes to wait.  Could also consider trial of physical therapy.  She will let us know if pain persisting  She has family history of end-stage renal disease in her mother but it sounds like her mother's kidney disease probably related to combination of diabetes, hypertension, congestive heart failure.  Nevertheless, patient would like to check kidney function.  We will check basic metabolic panel.  Her urine dipstick is reassuring with no proteinuria and she has no other worrisome symptoms  No orders of the defined types were placed in this encounter.   Follow-up: No follow-ups on file.    Carolann Littler, MD

## 2021-03-29 LAB — BASIC METABOLIC PANEL
BUN: 10 mg/dL (ref 6–23)
CO2: 27 mEq/L (ref 19–32)
Calcium: 9.5 mg/dL (ref 8.4–10.5)
Chloride: 102 mEq/L (ref 96–112)
Creatinine, Ser: 0.71 mg/dL (ref 0.40–1.20)
GFR: 97.44 mL/min (ref >60.00)
Glucose, Bld: 83 mg/dL (ref 70–99)
Potassium: 4.3 mEq/L (ref 3.5–5.1)
Sodium: 137 mEq/L (ref 135–145)

## 2021-04-11 ENCOUNTER — Encounter: Payer: BC Managed Care – PPO | Admitting: Family Medicine

## 2021-04-25 ENCOUNTER — Encounter: Payer: BC Managed Care – PPO | Admitting: Family Medicine

## 2021-05-23 ENCOUNTER — Encounter: Payer: Self-pay | Admitting: Family Medicine

## 2021-05-23 ENCOUNTER — Ambulatory Visit (INDEPENDENT_AMBULATORY_CARE_PROVIDER_SITE_OTHER): Payer: BC Managed Care – PPO | Admitting: Family Medicine

## 2021-05-23 VITALS — BP 112/76 | HR 74 | Temp 97.7°F | Ht 61.0 in | Wt 133.8 lb

## 2021-05-23 DIAGNOSIS — Z0000 Encounter for general adult medical examination without abnormal findings: Secondary | ICD-10-CM | POA: Diagnosis not present

## 2021-05-23 LAB — LIPID PANEL
Cholesterol: 205 mg/dL — ABNORMAL HIGH (ref 0–200)
HDL: 81.5 mg/dL (ref >39.00)
LDL Cholesterol: 114 mg/dL — ABNORMAL HIGH (ref 0–99)
NonHDL: 123.65
Total CHOL/HDL Ratio: 3
Triglycerides: 47 mg/dL (ref 0.0–149.0)
VLDL: 9.4 mg/dL (ref 0.0–40.0)

## 2021-05-23 LAB — BASIC METABOLIC PANEL
BUN: 13 mg/dL (ref 6–23)
CO2: 30 mEq/L (ref 19–32)
Calcium: 9.7 mg/dL (ref 8.4–10.5)
Chloride: 103 mEq/L (ref 96–112)
Creatinine, Ser: 0.74 mg/dL (ref 0.40–1.20)
GFR: 92.62 mL/min (ref >60.00)
Glucose, Bld: 92 mg/dL (ref 70–99)
Potassium: 4.3 mEq/L (ref 3.5–5.1)
Sodium: 139 mEq/L (ref 135–145)

## 2021-05-23 LAB — HEPATIC FUNCTION PANEL
ALT: 17 U/L (ref 0–35)
AST: 22 U/L (ref 0–37)
Albumin: 4.6 g/dL (ref 3.5–5.2)
Alkaline Phosphatase: 63 U/L (ref 39–117)
Bilirubin, Direct: 0.1 mg/dL (ref 0.0–0.3)
Total Bilirubin: 0.4 mg/dL (ref 0.2–1.2)
Total Protein: 7.5 g/dL (ref 6.0–8.3)

## 2021-05-23 LAB — CBC WITH DIFFERENTIAL/PLATELET
Basophils Absolute: 0.1 10*3/uL (ref 0.0–0.1)
Basophils Relative: 1.1 % (ref 0.0–3.0)
Eosinophils Absolute: 0.2 10*3/uL (ref 0.0–0.7)
Eosinophils Relative: 3.5 % (ref 0.0–5.0)
HCT: 42.8 % (ref 36.0–46.0)
Hemoglobin: 14.5 g/dL (ref 12.0–15.0)
Lymphocytes Relative: 33.4 % (ref 12.0–46.0)
Lymphs Abs: 1.7 10*3/uL (ref 0.7–4.0)
MCHC: 33.9 g/dL (ref 30.0–36.0)
MCV: 94.3 fl (ref 78.0–100.0)
Monocytes Absolute: 0.4 10*3/uL (ref 0.1–1.0)
Monocytes Relative: 7.1 % (ref 3.0–12.0)
Neutro Abs: 2.8 10*3/uL (ref 1.4–7.7)
Neutrophils Relative %: 54.9 % (ref 43.0–77.0)
Platelets: 235 10*3/uL (ref 150.0–400.0)
RBC: 4.54 Mil/uL (ref 3.87–5.11)
RDW: 13.9 % (ref 11.5–15.5)
WBC: 5 10*3/uL (ref 4.0–10.5)

## 2021-05-23 LAB — HEMOGLOBIN A1C: Hgb A1c MFr Bld: 5.4 % (ref 4.6–6.5)

## 2021-05-23 LAB — TSH: TSH: 0.97 u[IU]/mL (ref 0.35–5.50)

## 2021-05-23 NOTE — Progress Notes (Signed)
? ?Established Patient Office Visit ? ?Subjective:  ?Patient ID: Ruth Wong, female    DOB: Jan 25, 1971  Age: 51 y.o. MRN: [id number] ? ?CC:  ?Chief Complaint  ?Patient presents with  ? Annual Exam  ? ? ?HPI ?Ruth Wong presents for physical exam.  Generally fairly healthy.  Does not take any regular medications.  She does smoke but only a few cigarettes per day.  She has had some weight gain over the past year specially which she thinks is related to postmenopause. ? ?Health maintenance reviewed: ? ?-Declines flu vaccine ?-Colonoscopy is due 4/31 ?-Tetanus due 2030 ?-She gets annual mammograms through her GYN and is up-to-date ?-Pap smear up-to-date and through GYN ?-No history of shingles vaccine. ?-No history of hepatitis C screening.  She is low risk. ? ?Social history-she is married.  Very supportive husband.  She has 1 child from previous marriage and 3 stepchildren.  Her grandson and his father currently live with them.  She smokes as above less than 1/2 pack cigarettes per day.  No regular alcohol. ? ?Family history-mother had type 2 diabetes, CAD, chronic kidney disease.  Father is in his 26s and history of hypertension and stroke. ? ?Past Medical History:  ?Diagnosis Date  ? Allergy   ? seasonal  ? ? ?Past Surgical History:  ?Procedure Laterality Date  ? LAPAROSCOPY  1987  ? WISDOM TOOTH EXTRACTION    ? ? ?Family History  ?Problem Relation Age of Onset  ? Kidney disease Mother   ? Diabetes Mother   ? Heart disease Mother 23  ?     cabg  ? Stroke Father   ? Hypertension Father   ? Colon polyps Brother   ? Cancer Paternal Uncle   ?     colon  ? Colon cancer Paternal Uncle 35  ?     undiagnosed colon cancer- died 60  ? Cancer Paternal Grandmother   ?     colon  ? Colon cancer Paternal Grandmother   ? Esophageal cancer Neg Hx   ? Rectal cancer Neg Hx   ? Stomach cancer Neg Hx   ? ? ?Social History  ? ?Socioeconomic History  ? Marital status: Married  ?  Spouse name: Not on file  ? Number of children:  Not on file  ? Years of education: Not on file  ? Highest education level: Not on file  ?Occupational History  ? Not on file  ?Tobacco Use  ? Smoking status: Some Days  ?  Years: 10.00  ?  Types: Cigarettes  ? Smokeless tobacco: Never  ? Tobacco comments:  ?  5-6 a day   ?Vaping Use  ? Vaping Use: Former  ?Substance and Sexual Activity  ? Alcohol use: Yes  ?  Comment: rare   ? Drug use: Yes  ?  Frequency: 1.0 times per week  ?  Types: Marijuana  ? Sexual activity: Not on file  ?Other Topics Concern  ? Not on file  ?Social History Narrative  ? Not on file  ? ?Social Determinants of Health  ? ?Financial Resource Strain: Not on file  ?Food Insecurity: Not on file  ?Transportation Needs: Not on file  ?Physical Activity: Not on file  ?Stress: Not on file  ?Social Connections: Not on file  ?Intimate Partner Violence: Not on file  ? ? ?Outpatient Medications Prior to Visit  ?Medication Sig Dispense Refill  ? ibuprofen (ADVIL,MOTRIN) 200 MG tablet Take 200 mg by mouth every 6 (six)  hours as needed.    ? valACYclovir (VALTREX) 1000 MG tablet Two tablets twice daily for one day prn cold sores 30 tablet 0  ? ?No facility-administered medications prior to visit.  ? ? ?No Known Allergies ? ?ROS ?Review of Systems  ?Constitutional:  Negative for activity change, appetite change, fatigue, fever and unexpected weight change.  ?HENT:  Negative for ear pain, hearing loss, sore throat and trouble swallowing.   ?Eyes:  Negative for visual disturbance.  ?Respiratory:  Negative for cough and shortness of breath.   ?Cardiovascular:  Negative for chest pain and palpitations.  ?Gastrointestinal:  Negative for abdominal pain, blood in stool, constipation and diarrhea.  ?Endocrine: Negative for polydipsia and polyuria.  ?Genitourinary:  Negative for dysuria and hematuria.  ?Musculoskeletal:  Negative for arthralgias, back pain and myalgias.  ?Skin:  Negative for rash.  ?Neurological:  Negative for dizziness, syncope and headaches.   ?Hematological:  Negative for adenopathy.  ?Psychiatric/Behavioral:  Negative for confusion and dysphoric mood.   ? ?  ?Objective:  ?  ?Physical Exam ?Vitals reviewed.  ?Constitutional:   ?   Appearance: Normal appearance.  ?HENT:  ?   Right Ear: Tympanic membrane normal.  ?   Left Ear: Tympanic membrane normal.  ?Cardiovascular:  ?   Rate and Rhythm: Normal rate and regular rhythm.  ?Pulmonary:  ?   Effort: Pulmonary effort is normal.  ?   Breath sounds: Normal breath sounds.  ?Abdominal:  ?   Palpations: Abdomen is soft. There is no mass.  ?   Tenderness: There is no abdominal tenderness. There is no guarding.  ?Musculoskeletal:  ?   Cervical back: Neck supple.  ?   Right lower leg: No edema.  ?   Left lower leg: No edema.  ?Lymphadenopathy:  ?   Cervical: No cervical adenopathy.  ?Neurological:  ?   General: No focal deficit present.  ?   Mental Status: She is alert.  ?Psychiatric:     ?   Mood and Affect: Mood normal.  ? ? ?BP 112/76 (BP Location: Left Arm, Patient Position: Sitting, Cuff Size: Normal)   Pulse 74   Temp 97.7 ?F (36.5 ?C) (Oral)   Ht '5\' 1"'$  (1.549 m)   Wt 133 lb 12.8 oz (60.7 kg)   SpO2 98%   BMI 25.28 kg/m?  ?Wt Readings from Last 3 Encounters:  ?05/23/21 133 lb 12.8 oz (60.7 kg)  ?03/28/21 130 lb 4.8 oz (59.1 kg)  ?06/29/19 124 lb (56.2 kg)  ? ? ? ?Health Maintenance Due  ?Topic Date Due  ? HIV Screening  Never done  ? Hepatitis C Screening  Never done  ? Zoster Vaccines- Shingrix (1 of 2) Never done  ? COVID-19 Vaccine (4 - Booster) 04/18/2020  ? PAP SMEAR-Modifier  04/13/2021  ? ? ?There are no preventive care reminders to display for this patient. ? ?Lab Results  ?Component Value Date  ? TSH 1.08 03/24/2018  ? ?Lab Results  ?Component Value Date  ? WBC 7.6 03/24/2018  ? HGB 13.7 03/24/2018  ? HCT 40.7 03/24/2018  ? MCV 93.8 03/24/2018  ? PLT 215.0 03/24/2018  ? ?Lab Results  ?Component Value Date  ? NA 137 03/28/2021  ? K 4.3 03/28/2021  ? CO2 27 03/28/2021  ? GLUCOSE 83 03/28/2021  ?  BUN 10 03/28/2021  ? CREATININE 0.71 03/28/2021  ? BILITOT 1.1 03/24/2018  ? ALKPHOS 50 03/24/2018  ? AST 14 03/24/2018  ? ALT 12 03/24/2018  ? PROT 6.9  03/24/2018  ? ALBUMIN 4.4 03/24/2018  ? CALCIUM 9.5 03/28/2021  ? GFR 97.44 03/28/2021  ? ?Lab Results  ?Component Value Date  ? CHOL 172 03/24/2018  ? ?Lab Results  ?Component Value Date  ? HDL 63.90 03/24/2018  ? ?Lab Results  ?Component Value Date  ? [place] 99 03/24/2018  ? ?Lab Results  ?Component Value Date  ? TRIG 47.0 03/24/2018  ? ?Lab Results  ?Component Value Date  ? CHOLHDL 3 03/24/2018  ? ?No results found for: HGBA1C ? ?  ?Assessment & Plan:  ? ?Problem List Items Addressed This Visit   ?None ?Visit Diagnoses   ? ? Physical exam    -  Primary  ? Relevant Orders  ? Basic metabolic panel  ? Lipid panel  ? CBC with Differential/Platelet  ? TSH  ? Hepatic function panel  ? Hemoglobin A1c  ? Hep C Antibody  ? ?  ?-She plans to continue with GYN follow-up regarding Pap smears and mammograms ?-Check labs including hepatitis C antibody ?-She will consider Shingrix vaccine but check on insurance coverage first. ?-Strongly encouraged to stop smoking altogether ?-Does not meet criteria for CT lung cancer screening based on less than 20-pack-year history ?-We did discuss consideration at some point for coronary calcium scan with her family history of CAD in her mother in her late 21s.  She will consider. ? ?No orders of the defined types were placed in this encounter. ? ? ?Follow-up: No follow-ups on file.  ? ? ?Carolann Littler, MD ?

## 2021-05-23 NOTE — Patient Instructions (Signed)
Consider Shingrix vaccine and check on insurance coverage. ?

## 2021-05-24 LAB — HEPATITIS C ANTIBODY
Hepatitis C Ab: NONREACTIVE
SIGNAL TO CUT-OFF: <0.02 (ref <1.00)

## 2021-08-04 IMAGING — MG MM DIGITAL DIAGNOSTIC UNILAT*L* W/ TOMO W/ CAD
4 series · 4 of 12 positions shown · non-contrast
Comparison: Previous exam(s).

CLINICAL DATA: Possible asymmetry in anterior, inferior left breast
in the oblique projection of a recent screening mammogram.

EXAM:
DIGITAL DIAGNOSTIC UNILATERAL LEFT MAMMOGRAM WITH TOMOSYNTHESIS AND
CAD
TECHNIQUE: Left digital diagnostic mammography and breast tomosynthesis was
performed. The images were evaluated with computer-aided detection.

[L ML synth-2D]
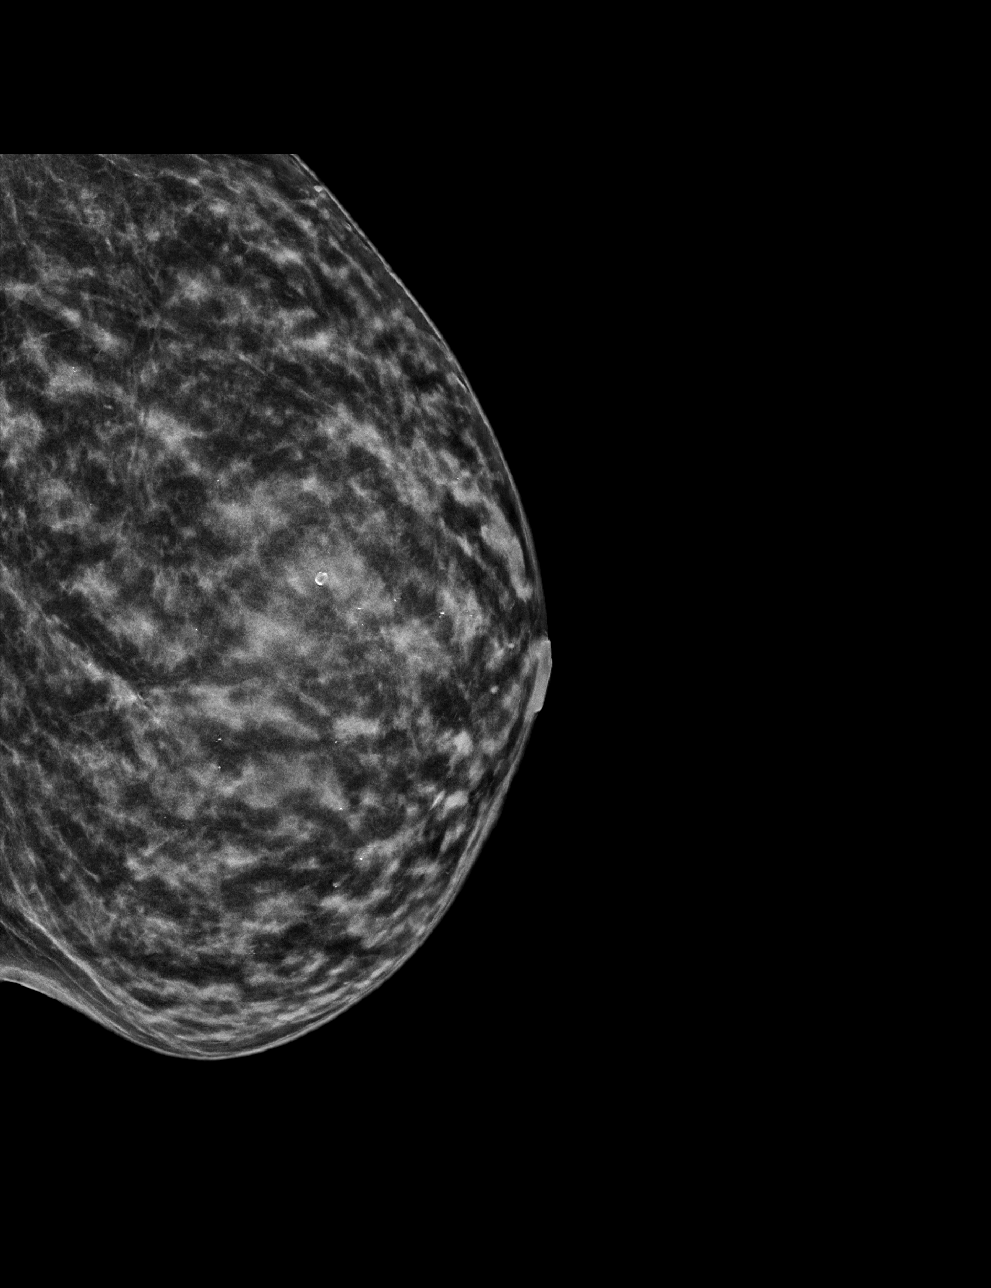

[L MLO synth-2D]
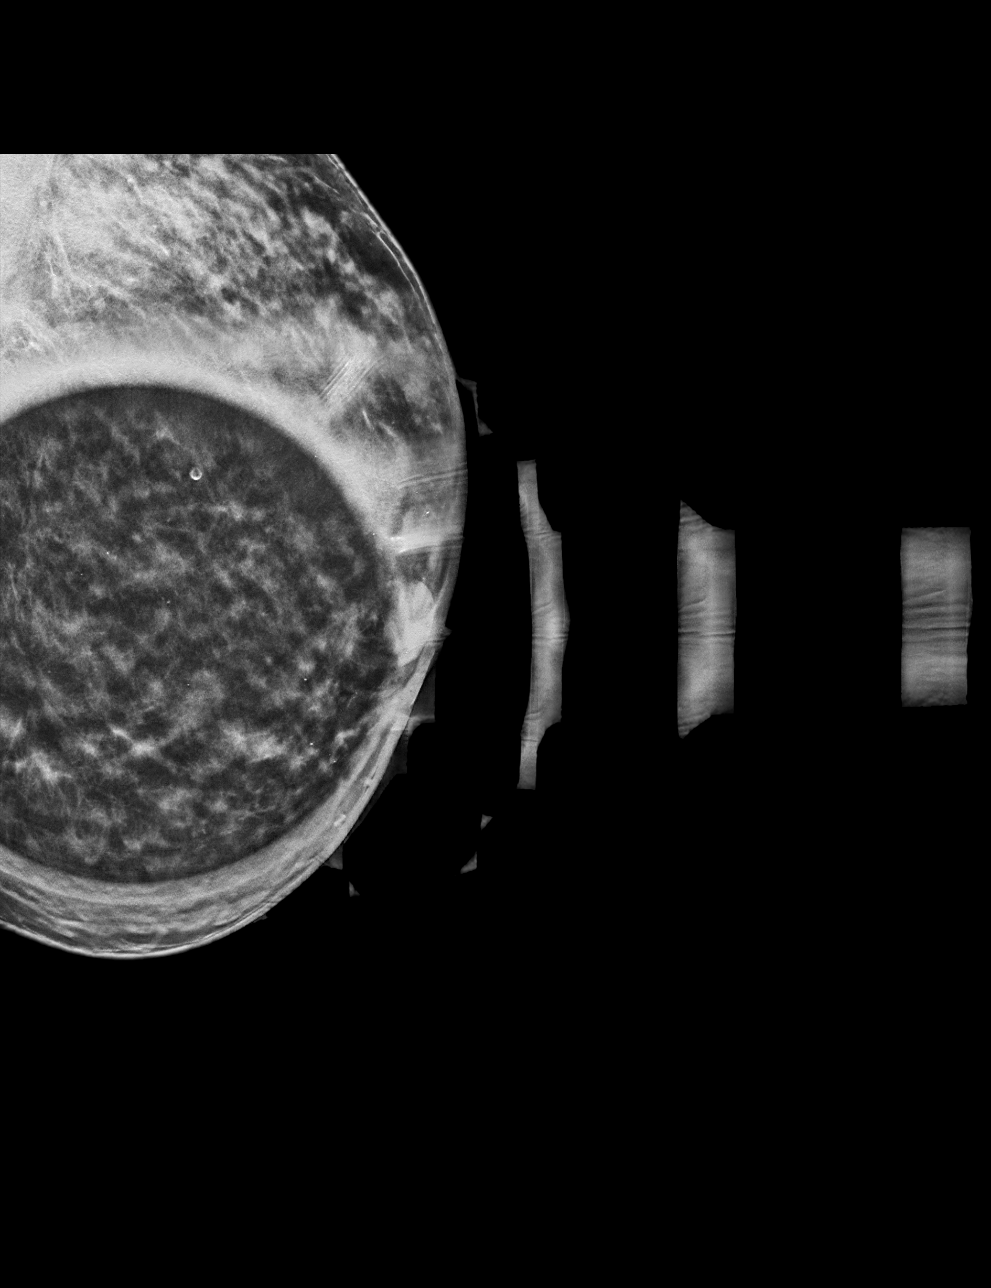

[L ML tomo · tomo slice 25/49.0]
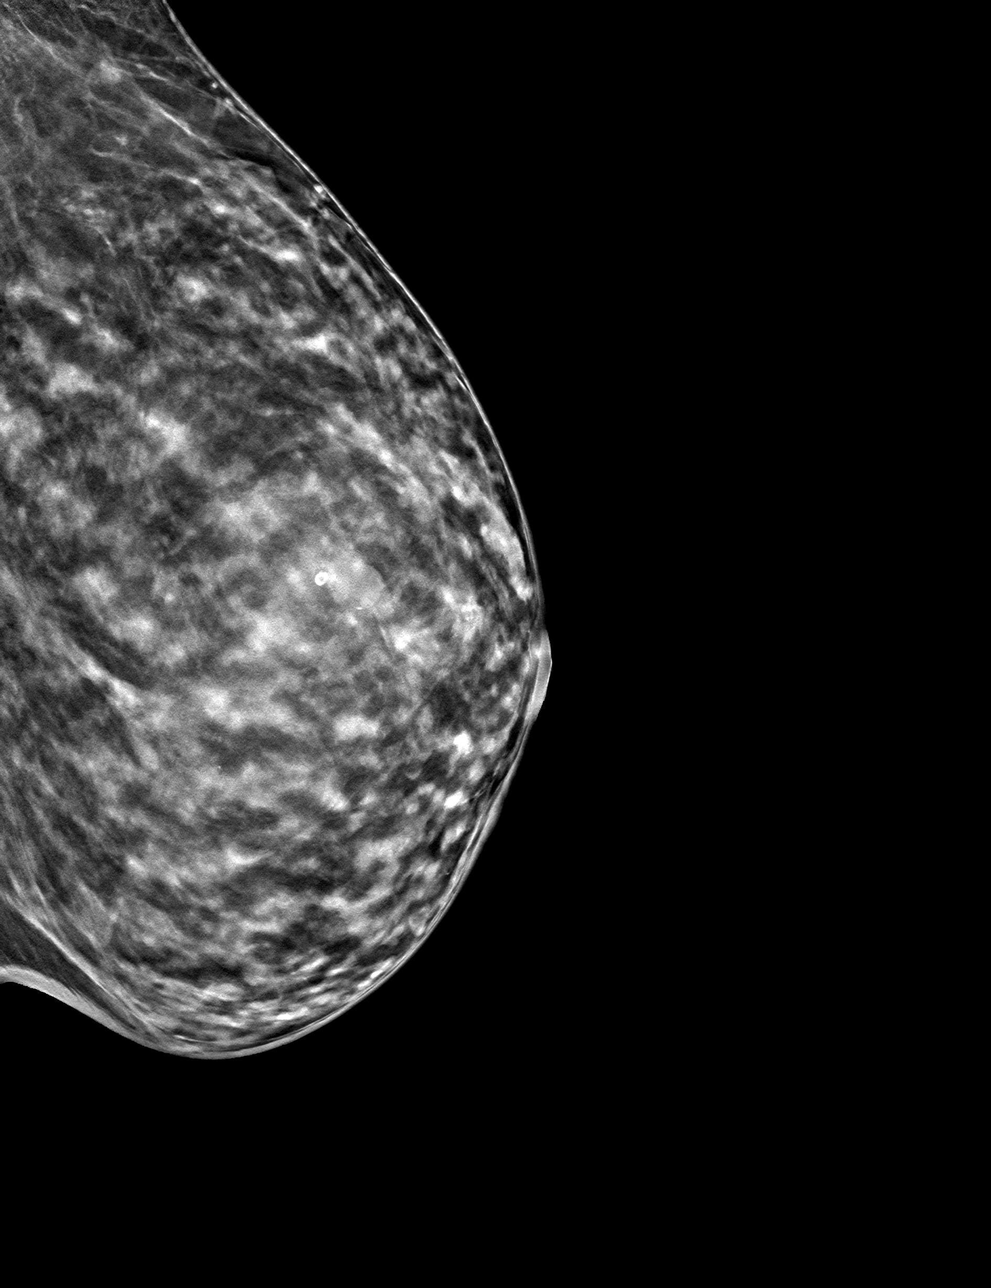

[L MLO tomo · tomo slice 25/50.0]
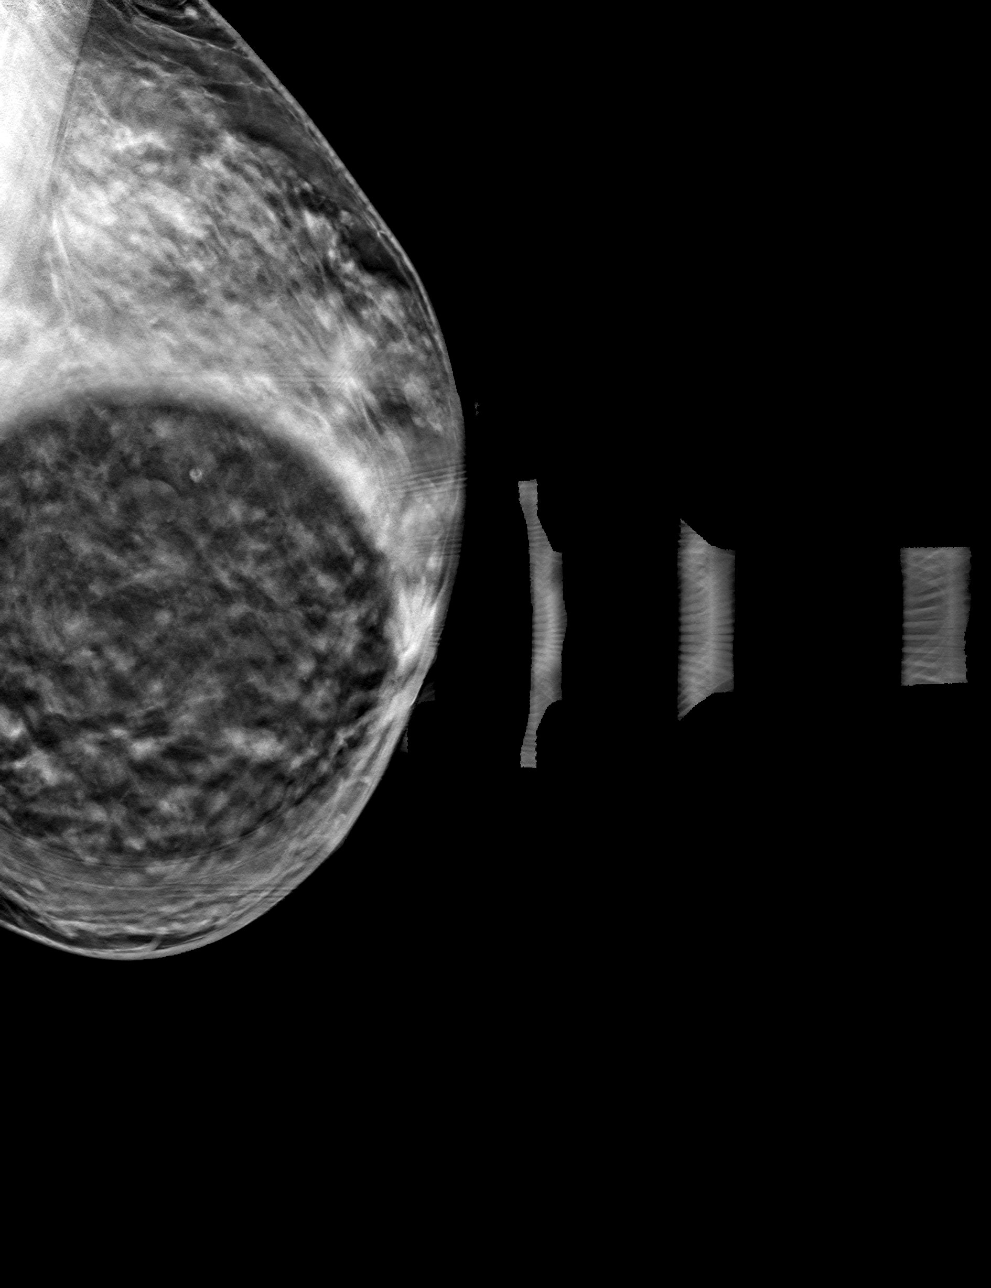

[4 of 12 positions shown; findings below may reference images not displayed]

ACR Breast Density Category c: The breast tissue is heterogeneously
dense, which may obscure small masses.
FINDINGS: 3D tomographic and 2D generated true lateral and spot compression
oblique images of the left breast demonstrate normal appearing
fibroglandular tissue at the location of the recently suspected
asymmetry, unchanged compared to previous examinations.
IMPRESSION: No evidence of malignancy. The recently suspected left breast
asymmetry was close apposition of normal breast tissue.

RECOMMENDATION:
Bilateral screening mammogram in 1 year.

I have discussed the findings and recommendations with the patient.
If applicable, a reminder letter will be sent to the patient
regarding the next appointment.

BI-RADS CATEGORY  1: Negative.

## 2023-04-08 ENCOUNTER — Encounter: Payer: BC Managed Care – PPO | Admitting: Family Medicine

## 2023-04-28 ENCOUNTER — Ambulatory Visit (INDEPENDENT_AMBULATORY_CARE_PROVIDER_SITE_OTHER): Payer: Managed Care, Other (non HMO) | Admitting: Family Medicine

## 2023-04-28 ENCOUNTER — Encounter: Payer: Self-pay | Admitting: Family Medicine

## 2023-04-28 VITALS — BP 126/80 | HR 89 | Temp 98.0°F | Ht 61.81 in | Wt 121.2 lb

## 2023-04-28 DIAGNOSIS — Z0000 Encounter for general adult medical examination without abnormal findings: Secondary | ICD-10-CM | POA: Diagnosis not present

## 2023-04-28 DIAGNOSIS — Z23 Encounter for immunization: Secondary | ICD-10-CM

## 2023-04-28 LAB — CBC WITH DIFFERENTIAL/PLATELET
Basophils Absolute: 0.1 10*3/uL (ref 0.0–0.1)
Basophils Relative: 1.4 % (ref 0.0–3.0)
Eosinophils Absolute: 0.2 10*3/uL (ref 0.0–0.7)
Eosinophils Relative: 3.6 % (ref 0.0–5.0)
HCT: 42.2 % (ref 36.0–46.0)
Hemoglobin: 13.8 g/dL (ref 12.0–15.0)
Lymphocytes Relative: 33.3 % (ref 12.0–46.0)
Lymphs Abs: 1.7 10*3/uL (ref 0.7–4.0)
MCHC: 32.8 g/dL (ref 30.0–36.0)
MCV: 93.1 fL (ref 78.0–100.0)
Monocytes Absolute: 0.3 10*3/uL (ref 0.1–1.0)
Monocytes Relative: 6.1 % (ref 3.0–12.0)
Neutro Abs: 2.9 10*3/uL (ref 1.4–7.7)
Neutrophils Relative %: 55.6 % (ref 43.0–77.0)
Platelets: 244 10*3/uL (ref 150.0–400.0)
RBC: 4.53 Mil/uL (ref 3.87–5.11)
RDW: 14.2 % (ref 11.5–15.5)
WBC: 5.2 10*3/uL (ref 4.0–10.5)

## 2023-04-28 LAB — LIPID PANEL
Cholesterol: 194 mg/dL (ref 0–200)
HDL: 80.3 mg/dL (ref >39.00)
LDL Cholesterol: 100 mg/dL — ABNORMAL HIGH (ref 0–99)
NonHDL: 113.29
Total CHOL/HDL Ratio: 2
Triglycerides: 67 mg/dL (ref 0.0–149.0)
VLDL: 13.4 mg/dL (ref 0.0–40.0)

## 2023-04-28 LAB — HEMOGLOBIN A1C: Hgb A1c MFr Bld: 5.5 % (ref 4.6–6.5)

## 2023-04-28 LAB — BASIC METABOLIC PANEL
BUN: 12 mg/dL (ref 6–23)
CO2: 27 meq/L (ref 19–32)
Calcium: 9.7 mg/dL (ref 8.4–10.5)
Chloride: 106 meq/L (ref 96–112)
Creatinine, Ser: 0.69 mg/dL (ref 0.40–1.20)
GFR: 98.01 mL/min (ref >60.00)
Glucose, Bld: 103 mg/dL — ABNORMAL HIGH (ref 70–99)
Potassium: 4.1 meq/L (ref 3.5–5.1)
Sodium: 142 meq/L (ref 135–145)

## 2023-04-28 LAB — HEPATIC FUNCTION PANEL
ALT: 12 U/L (ref 0–35)
AST: 18 U/L (ref 0–37)
Albumin: 4.6 g/dL (ref 3.5–5.2)
Alkaline Phosphatase: 65 U/L (ref 39–117)
Bilirubin, Direct: 0.1 mg/dL (ref 0.0–0.3)
Total Bilirubin: 0.8 mg/dL (ref 0.2–1.2)
Total Protein: 7.6 g/dL (ref 6.0–8.3)

## 2023-04-28 NOTE — Progress Notes (Signed)
 Established Patient Office Visit  Subjective   Patient ID: Ruth Wong, female    DOB: 12-22-1970  Age: 54 y.o. MRN: [id number]  No chief complaint on file.   HPI   Ruth Wong is seen today for physical exam.  Generally healthy.  She takes no regular medications.  Does still smoke less than half pack cigarettes per day.  No smoking for about 15 years.  She does see GYN regularly and is getting Pap smears and mammograms annually.  Health maintenance reviewed.  She has not had flu vaccine or Pneumovax.  She declines at this time.  She also has not had shingles vaccine and would like to proceed with that.  Tetanus is up-to-date.  Colonoscopy due 2031.  Prior hepatitis C screen negative.  Social history-married.  She works at home.  Still helping to care for her father who lives with them.  She has 1 child from prior marriage and 3 stepchildren.  Smokes less than half pack cigarettes per day for about 15 years.  Rare alcohol.  Family history-father has history of hypertension and stroke.  Mom had type 2 diabetes, CAD, chronic kidney disease  Past Medical History:  Diagnosis Date   Allergy    seasonal   Past Surgical History:  Procedure Laterality Date   LAPAROSCOPY  56   WISDOM TOOTH EXTRACTION      reports that she has been smoking cigarettes. She has never used smokeless tobacco. She reports current alcohol use. She reports current drug use. Frequency: 1.00 time per week. Drug: Marijuana. family history includes Cancer in her paternal grandmother and paternal uncle; Colon cancer in her paternal grandmother; Colon cancer (age of onset: 21) in her paternal uncle; Colon polyps in her brother; Diabetes in her mother; Heart disease (age of onset: 62) in her mother; Hypertension in her father; Kidney disease in her mother; Stroke in her father. No Known Allergies  Review of Systems  Constitutional:  Negative for malaise/fatigue.  Eyes:  Negative for blurred vision.  Respiratory:   Negative for shortness of breath.   Cardiovascular:  Negative for chest pain.  Neurological:  Negative for dizziness, weakness and headaches.      Objective:     BP 126/80 (BP Location: Left Arm, Patient Position: Sitting, Cuff Size: Normal)   Pulse 89   Temp 98 F (36.7 C) (Oral)   Ht 5' 1.81" (1.57 m)   Wt 121 lb 3.2 oz (55 kg)   SpO2 97%   BMI 22.30 kg/m  BP Readings from Last 3 Encounters:  04/28/23 126/80  05/23/21 112/76  03/28/21 120/80   Wt Readings from Last 3 Encounters:  04/28/23 121 lb 3.2 oz (55 kg)  05/23/21 133 lb 12.8 oz (60.7 kg)  03/28/21 130 lb 4.8 oz (59.1 kg)      Physical Exam Vitals reviewed.  Constitutional:      Appearance: She is well-developed.  Eyes:     Pupils: Pupils are equal, round, and reactive to light.  Neck:     Thyroid: No thyromegaly.     Vascular: No JVD.  Cardiovascular:     Rate and Rhythm: Normal rate and regular rhythm.     Heart sounds:     No gallop.  Pulmonary:     Effort: Pulmonary effort is normal. No respiratory distress.     Breath sounds: Normal breath sounds. No wheezing or rales.  Musculoskeletal:     Cervical back: Neck supple.  Neurological:     Mental Status:  She is alert.      No results found for any visits on 04/28/23.    The 10-year ASCVD risk score (Arnett DK, et al., 2019) is: 3%    Assessment & Plan:   Problem List Items Addressed This Visit   None Visit Diagnoses       Physical exam    -  Primary   Relevant Orders   Basic metabolic panel   Lipid panel   CBC with Differential/Platelet   Hepatic function panel   Hemoglobin A1c     Generally healthy 52 year old female.  She does have history of smoking as above with less than 15-pack-year history  -Strongly advised to stop smoking altogether -She plans to continue GYN follow-up for Pap smears and mammograms -Discussed vaccinations.  She did not get flu vaccine and declines.  He also mention Prevnar 20 and she would like to wait  on that.  She did agree to getting Shingrix.  We discussed potential side effects.  Also remind her that she needs to get second Shingrix in 2 to 6 months -Colonoscopy is up-to-date -Does not qualify for low-dose CT lung cancer screening based on less than 15-pack-year history  No follow-ups on file.    Ruth Peat, MD

## 2023-06-27 ENCOUNTER — Ambulatory Visit: Payer: Managed Care, Other (non HMO)
